# Patient Record
Sex: Female | Born: 1960 | Hispanic: Yes | Marital: Married | Smoking: Never smoker
Health system: Southern US, Community
[De-identification: ages and names within clinical notes are randomized; demographics above are authoritative.]

## PROBLEM LIST (undated history)

## (undated) DIAGNOSIS — I493 Ventricular premature depolarization: Secondary | ICD-10-CM

## (undated) DIAGNOSIS — Z87442 Personal history of urinary calculi: Secondary | ICD-10-CM

## (undated) DIAGNOSIS — Z9889 Other specified postprocedural states: Secondary | ICD-10-CM

## (undated) DIAGNOSIS — I10 Essential (primary) hypertension: Secondary | ICD-10-CM

## (undated) DIAGNOSIS — M109 Gout, unspecified: Secondary | ICD-10-CM

## (undated) DIAGNOSIS — R112 Nausea with vomiting, unspecified: Secondary | ICD-10-CM

## (undated) DIAGNOSIS — I499 Cardiac arrhythmia, unspecified: Secondary | ICD-10-CM

## (undated) DIAGNOSIS — R7303 Prediabetes: Secondary | ICD-10-CM

## (undated) DIAGNOSIS — I161 Hypertensive emergency: Secondary | ICD-10-CM

## (undated) DIAGNOSIS — N200 Calculus of kidney: Secondary | ICD-10-CM

## (undated) DIAGNOSIS — N6019 Diffuse cystic mastopathy of unspecified breast: Secondary | ICD-10-CM

## (undated) DIAGNOSIS — M549 Dorsalgia, unspecified: Secondary | ICD-10-CM

## (undated) HISTORY — PX: BREAST BIOPSY: SHX20

## (undated) HISTORY — PX: ABDOMINAL HYSTERECTOMY: SHX81

## (undated) HISTORY — DX: Dorsalgia, unspecified: M54.9

## (undated) HISTORY — DX: Calculus of kidney: N20.0

## (undated) HISTORY — DX: Diffuse cystic mastopathy of unspecified breast: N60.19

## (undated) HISTORY — PX: APPENDECTOMY: SHX54

## (undated) HISTORY — DX: Essential (primary) hypertension: I10

## (undated) HISTORY — PX: CHOLECYSTECTOMY: SHX55

---

## 2005-03-19 ENCOUNTER — Ambulatory Visit: Payer: Self-pay | Admitting: Family Medicine

## 2008-12-12 ENCOUNTER — Emergency Department: Payer: Self-pay | Admitting: Emergency Medicine

## 2010-02-05 ENCOUNTER — Emergency Department: Payer: Self-pay | Admitting: Emergency Medicine

## 2010-02-25 HISTORY — PX: COLONOSCOPY: SHX174

## 2010-11-16 ENCOUNTER — Ambulatory Visit: Payer: Self-pay | Admitting: General Surgery

## 2012-07-24 ENCOUNTER — Encounter: Payer: Self-pay | Admitting: *Deleted

## 2012-07-24 DIAGNOSIS — N6019 Diffuse cystic mastopathy of unspecified breast: Secondary | ICD-10-CM | POA: Insufficient documentation

## 2012-11-25 ENCOUNTER — Ambulatory Visit: Payer: Self-pay | Admitting: General Surgery

## 2012-12-16 ENCOUNTER — Ambulatory Visit: Payer: Self-pay

## 2012-12-28 ENCOUNTER — Encounter: Payer: Self-pay | Admitting: General Surgery

## 2012-12-30 ENCOUNTER — Encounter: Payer: Self-pay | Admitting: *Deleted

## 2013-01-12 ENCOUNTER — Encounter: Payer: Self-pay | Admitting: *Deleted

## 2013-01-23 ENCOUNTER — Emergency Department: Payer: Self-pay | Admitting: Emergency Medicine

## 2013-01-23 LAB — URINALYSIS, COMPLETE
Bilirubin,UR: NEGATIVE
Blood: NEGATIVE
Ketone: NEGATIVE
Protein: NEGATIVE
RBC,UR: <1 /HPF (ref 0–5)
Specific Gravity: 1.012 (ref 1.003–1.030)

## 2013-09-08 DIAGNOSIS — I10 Essential (primary) hypertension: Secondary | ICD-10-CM

## 2013-09-08 HISTORY — DX: Essential (primary) hypertension: I10

## 2013-12-27 ENCOUNTER — Encounter: Payer: Self-pay | Admitting: *Deleted

## 2014-06-10 ENCOUNTER — Other Ambulatory Visit: Payer: Self-pay | Admitting: Oncology

## 2014-06-10 DIAGNOSIS — Z1231 Encounter for screening mammogram for malignant neoplasm of breast: Secondary | ICD-10-CM

## 2014-07-06 ENCOUNTER — Ambulatory Visit
Admission: RE | Admit: 2014-07-06 | Discharge: 2014-07-06 | Disposition: A | Discharge status: Home / Self Care | Payer: Self-pay | Source: Ambulatory Visit | Attending: Oncology | Admitting: Oncology

## 2014-07-06 ENCOUNTER — Encounter: Payer: Self-pay | Admitting: *Deleted

## 2014-07-06 ENCOUNTER — Inpatient Hospital Stay: Payer: Self-pay | Attending: Oncology | Admitting: *Deleted

## 2014-07-06 VITALS — BP 149/94 | HR 67 | Temp 95.9°F | Resp 17 | Ht 63.39 in | Wt 170.6 lb

## 2014-07-06 DIAGNOSIS — Z1231 Encounter for screening mammogram for malignant neoplasm of breast: Secondary | ICD-10-CM

## 2014-07-06 DIAGNOSIS — Z0000 Encounter for general adult medical examination without abnormal findings: Secondary | ICD-10-CM

## 2014-07-06 NOTE — Progress Notes (Signed)
Subjective:     Patient ID: Kristine Shaw, female   DOB: 1960-04-06, 54 y.o.   MRN: [id number]  Gynecologic Exam     Review of Systems     Objective:   Physical Exam  Pulmonary/Chest: Right breast exhibits tenderness. Right breast exhibits no inverted nipple, no mass, no nipple discharge and no skin change. Left breast exhibits tenderness. Left breast exhibits no inverted nipple, no mass, no nipple discharge and no skin change.       Assessment:  54 year old English speaking Hispanic female [facility] exam and mammogram.  Taught breast health awareness. Clinical breast exam reveals bilateral generalized breast tenderness. Patient states she drinks a lot of coffee.  Encouraged to decrease caffeine intake.  She is agreeable.       Plan:    Bilateral screening mammogram ordered.  Follow-up per protocol.

## 2015-02-23 ENCOUNTER — Emergency Department
Admission: EM | Admit: 2015-02-23 | Discharge: 2015-02-23 | Disposition: A | Discharge status: Home / Self Care | Payer: PRIVATE HEALTH INSURANCE | Attending: Emergency Medicine | Admitting: Emergency Medicine

## 2015-02-23 ENCOUNTER — Emergency Department: Payer: PRIVATE HEALTH INSURANCE

## 2015-02-23 ENCOUNTER — Encounter: Payer: Self-pay | Admitting: *Deleted

## 2015-02-23 DIAGNOSIS — R05 Cough: Secondary | ICD-10-CM | POA: Diagnosis not present

## 2015-02-23 DIAGNOSIS — R1012 Left upper quadrant pain: Secondary | ICD-10-CM | POA: Diagnosis not present

## 2015-02-23 DIAGNOSIS — R110 Nausea: Secondary | ICD-10-CM | POA: Insufficient documentation

## 2015-02-23 DIAGNOSIS — M549 Dorsalgia, unspecified: Secondary | ICD-10-CM | POA: Insufficient documentation

## 2015-02-23 DIAGNOSIS — R109 Unspecified abdominal pain: Secondary | ICD-10-CM | POA: Diagnosis present

## 2015-02-23 DIAGNOSIS — Z79899 Other long term (current) drug therapy: Secondary | ICD-10-CM | POA: Insufficient documentation

## 2015-02-23 DIAGNOSIS — R0602 Shortness of breath: Secondary | ICD-10-CM | POA: Insufficient documentation

## 2015-02-23 LAB — CBC WITH DIFFERENTIAL/PLATELET
Basophils Absolute: 0.1 10*3/uL (ref 0–0.1)
Basophils Relative: 1 %
EOS ABS: 0.4 10*3/uL (ref 0–0.7)
EOS PCT: 4 %
HCT: 40.5 % (ref 35.0–47.0)
HEMOGLOBIN: 13.6 g/dL (ref 12.0–16.0)
LYMPHS ABS: 3.1 10*3/uL (ref 1.0–3.6)
LYMPHS PCT: 30 %
MCH: 28.2 pg (ref 26.0–34.0)
MCHC: 33.7 g/dL (ref 32.0–36.0)
MCV: 83.8 fL (ref 80.0–100.0)
MONOS PCT: 4 %
Monocytes Absolute: 0.4 10*3/uL (ref 0.2–0.9)
Neutro Abs: 6.3 10*3/uL (ref 1.4–6.5)
Neutrophils Relative %: 61 %
PLATELETS: 278 10*3/uL (ref 150–440)
RBC: 4.83 MIL/uL (ref 3.80–5.20)
RDW: 13.5 % (ref 11.5–14.5)
WBC: 10.3 10*3/uL (ref 3.6–11.0)

## 2015-02-23 LAB — COMPREHENSIVE METABOLIC PANEL
ALK PHOS: 84 U/L (ref 38–126)
ALT: 32 U/L (ref 14–54)
ANION GAP: 7 (ref 5–15)
AST: 21 U/L (ref 15–41)
Albumin: 4.1 g/dL (ref 3.5–5.0)
BUN: 16 mg/dL (ref 6–20)
CALCIUM: 9.6 mg/dL (ref 8.9–10.3)
CO2: 30 mmol/L (ref 22–32)
CREATININE: 1.09 mg/dL — AB (ref 0.44–1.00)
Chloride: 104 mmol/L (ref 101–111)
GFR, EST NON AFRICAN AMERICAN: 56 mL/min — AB (ref >60)
Glucose, Bld: 184 mg/dL — ABNORMAL HIGH (ref 65–99)
Potassium: 3.2 mmol/L — ABNORMAL LOW (ref 3.5–5.1)
SODIUM: 141 mmol/L (ref 135–145)
Total Bilirubin: 0.4 mg/dL (ref 0.3–1.2)
Total Protein: 7.7 g/dL (ref 6.5–8.1)

## 2015-02-23 LAB — URINALYSIS COMPLETE WITH MICROSCOPIC (ARMC ONLY)
BACTERIA UA: NONE SEEN
Bilirubin Urine: NEGATIVE
Glucose, UA: NEGATIVE mg/dL
HGB URINE DIPSTICK: NEGATIVE
Ketones, ur: NEGATIVE mg/dL
Leukocytes, UA: NEGATIVE
NITRITE: NEGATIVE
PH: 6 (ref 5.0–8.0)
PROTEIN: NEGATIVE mg/dL
RBC / HPF: NONE SEEN RBC/hpf (ref 0–5)
Specific Gravity, Urine: 1.013 (ref 1.005–1.030)

## 2015-02-23 LAB — TROPONIN I

## 2015-02-23 MED ORDER — TRAMADOL HCL 50 MG PO TABS: 50.0000 mg | ORAL_TABLET | Freq: Four times a day (QID) | ORAL | Status: DC | PRN

## 2015-02-23 NOTE — Discharge Instructions (Signed)
Flank Pain °Flank pain refers to pain that is located on the side of the body between the upper abdomen and the back. The pain may occur over a short period of time (acute) or may be long-term or reoccurring (chronic). It may be mild or severe. Flank pain can be caused by many things. °CAUSES  °Some of the more common causes of flank pain include: °· Muscle strains.   °· Muscle spasms.   °· A disease of your spine (vertebral disk disease).   °· A lung infection (pneumonia).   °· Fluid around your lungs (pulmonary edema).   °· A kidney infection.   °· Kidney stones.   °· A very painful skin rash caused by the chickenpox virus (shingles).   °· Gallbladder disease.   °HOME CARE INSTRUCTIONS  °Home care will depend on the cause of your pain. In general, °· Rest as directed by your caregiver. °· Drink enough fluids to keep your urine clear or pale yellow. °· Only take over-the-counter or prescription medicines as directed by your caregiver. Some medicines may help relieve the pain. °· Tell your caregiver about any changes in your pain. °· Follow up with your caregiver as directed. °SEEK IMMEDIATE MEDICAL CARE IF:  °· Your pain is not controlled with medicine.   °· You have new or worsening symptoms. °· Your pain increases.   °· You have abdominal pain.   °· You have shortness of breath.   °· You have persistent nausea or vomiting.   °· You have swelling in your abdomen.   °· You feel faint or pass out.   °· You have blood in your urine. °· You have a fever or persistent symptoms for more than 2-3 days. °· You have a fever and your symptoms suddenly get worse. °MAKE SURE YOU:  °· Understand these instructions. °· Will watch your condition. °· Will get help right away if you are not doing well or get worse. °  °This information is not intended to replace advice given to you by your health care provider. Make sure you discuss any questions you have with your health care provider. °  °Document Released: 04/04/2005 Document  Revised: 11/06/2011 Document Reviewed: 09/26/2011 °Elsevier Interactive Patient Education ©2016 [employer] ° °

## 2015-02-23 NOTE — ED Provider Notes (Signed)
[facility] Emergency Department Provider Note  Time seen: 7:06 PM  I have reviewed the triage vital signs and the nursing notes.   HISTORY  Chief Complaint Cough    HPI Kristine Shaw is a 54 y.o. female with a past medical history of kidney stones, back pain, presents the emergency department with left back pain, and left flank pain. According to the patient for the past several months she has intermittently had left back pain which radiates around to her left flank, under her left breast. States she has been coughing over the past one month or so however states the pain started prior to that. States for the past 2 weeks the pain has been fairly constant which she describes as a stabbing pain mostly in the left back. Denies any dysuria or hematuria. States she has had a history of kidney stones to which this feels identical. Denies any sputum production, states the cough is largely resolved at this time did have yellow sputum at a time but none recently. Denies any recent pain or swelling in the legs. Describes feeling short of breath nauseated or diaphoretic.Describes the pain as moderate, sharp in all of the.     Past Medical History  Diagnosis Date  . Diffuse cystic mastopathy   . Kidney stone   . Back pain     Patient Active Problem List   Diagnosis Date Noted  . Diffuse cystic mastopathy     Past Surgical History  Procedure Laterality Date  . Cesarean section    . Abdominal hysterectomy    . Cholecystectomy    . Appendectomy    . Colonoscopy  2012    [name]  . Breast biopsy Right     ? date done by dr Evette Cristalsankar    Current Outpatient Rx  Name  Route  Sig  Dispense  Refill  . ibuprofen (ADVIL,MOTRIN) 600 MG tablet   Oral   Take 500 mg by mouth.         . losartan-hydrochlorothiazide (HYZAAR) 50-12.5 MG per tablet      TAKE 1 TABLET BY MOUTH ONCE DAILY.           Allergies Review of patient's allergies indicates no known  allergies.  No family history on file.  Social History Social History  Substance Use Topics  . Smoking status: Never Smoker   . Smokeless tobacco: None  . Alcohol Use: No     Comment: Special occasion    Review of Systems Constitutional: Negative for fever. Cardiovascular: Positive for left back pain/left lower chest/left flank pain Respiratory: Negative for shortness of breath. Gastrointestinal: Left flank pain. Negative for nausea, vomiting, diarrhea. Genitourinary: Negative for dysuria. Negative hematuria Musculoskeletal: Positive for left back pain Neurological: Negative for headache 10-point ROS otherwise negative.  ____________________________________________   PHYSICAL EXAM:  VITAL SIGNS: ED Triage Vitals  Enc Vitals Group     BP 02/23/15 1729 127/81 mmHg     Pulse Rate 02/23/15 1729 72     Resp 02/23/15 1729 24     Temp 02/23/15 1729 98.3 F (36.8 C)     Temp Source 02/23/15 1729 Oral     SpO2 02/23/15 1729 97 %     Weight 02/23/15 1729 170 lb (77.111 kg)     Height 02/23/15 1729 5\' 2"  (1.575 m)     Head Cir --      Peak Flow --      Pain Score 02/23/15 1730 6  Pain Loc --      Pain Edu? --      Excl. in GC? --     Constitutional: Alert and oriented. Well appearing and in no distress. Eyes: Normal exam ENT   Head: Normocephalic and atraumatic   Mouth/Throat: Mucous membranes are moist. Cardiovascular: Normal rate, regular rhythm. No murmurs, rubs, or gallops. Respiratory: Normal respiratory effort without tachypnea nor retractions. Breath sounds are clear and equal bilaterally. No wheezes/rales/rhonchi. Gastrointestinal: Soft, mild left upper quadrant tenderness palpation. Moderate left CVA tenderness palpation. No rebound or guarding. No distention. Musculoskeletal: Nontender with normal range of motion in all extremities. No lower extremity tenderness or edema. Neurologic:  Normal speech and language. No gross focal neurologic  deficits Skin:  Skin is warm, dry and intact.  Psychiatric: Mood and affect are normal. Speech and behavior are normal.  ____________________________________________    EKG  EKG reviewed and interpreted by myself shows normal sinus rhythm at 73 bpm, narrow QRS, normal axis, normal intervals, nonspecific ST changes. No ST elevations.  ____________________________________________    RADIOLOGY  Chest x-ray shows no acute abnormality   INITIAL IMPRESSION / ASSESSMENT AND PLAN / ED COURSE  Pertinent labs & imaging results that were available during my care of the patient were reviewed by me and considered in my medical decision making (see chart for details).  Chest x-ray shows no acute abnormality, labs are largely within normal limits. Troponin is negative. Patient states her pain feels identical to the kidney stones she experienced approximately 10 years ago. We will check a urinalysis, and proceed with a CT abdomen/pelvis without contrast to evaluate for renal or ureteral lithiasis. Patient denies any pleuritic component to the pain. It is also possible that the pain is musculoskeletal due to the patient's recent cough.   CT shows left ovarian cyst, otherwise within normal limits. There is a 9 mm bladder calculus. I discussed results with the patient, she will follow-up with an OB/GYN for reevaluation. It is not clear if this is the cause of the patient's discomfort however. It is also possible could be musculoskeletal pain from her recent coughing. We'll place the patient a short course of Ultram, and have her follow up with OB/GYN in the next 1-2 weeks for recheck. Patient is agreeable to plan. ____________________________________________   FINAL CLINICAL IMPRESSION(S) / ED DIAGNOSES  Left flank pain   Minna Antis, MD 02/23/15 2029

## 2015-02-23 NOTE — ED Notes (Signed)
Pain under left breast past week, comes and goes, some yellow prod cough

## 2015-04-05 ENCOUNTER — Inpatient Hospital Stay: Payer: PRIVATE HEALTH INSURANCE | Attending: Obstetrics and Gynecology

## 2015-04-05 ENCOUNTER — Encounter: Payer: Self-pay | Admitting: Obstetrics and Gynecology

## 2015-04-05 ENCOUNTER — Other Ambulatory Visit: Payer: Self-pay | Admitting: *Deleted

## 2015-04-05 VITALS — BP 176/122 | HR 73 | Ht 62.0 in | Wt 173.5 lb

## 2015-04-05 DIAGNOSIS — Z90710 Acquired absence of both cervix and uterus: Secondary | ICD-10-CM | POA: Diagnosis not present

## 2015-04-05 DIAGNOSIS — N200 Calculus of kidney: Secondary | ICD-10-CM | POA: Diagnosis not present

## 2015-04-05 DIAGNOSIS — N261 Atrophy of kidney (terminal): Secondary | ICD-10-CM | POA: Diagnosis not present

## 2015-04-05 DIAGNOSIS — N83202 Unspecified ovarian cyst, left side: Secondary | ICD-10-CM | POA: Diagnosis not present

## 2015-04-05 DIAGNOSIS — Z87442 Personal history of urinary calculi: Secondary | ICD-10-CM

## 2015-04-05 DIAGNOSIS — I10 Essential (primary) hypertension: Secondary | ICD-10-CM

## 2015-04-05 DIAGNOSIS — N6011 Diffuse cystic mastopathy of right breast: Secondary | ICD-10-CM

## 2015-04-05 NOTE — Progress Notes (Signed)
Gynecologic Oncology Consult Visit   Referring Provider:  Dr Luella Cook  Chief Concern: ovarian cyst  Subjective:  Kristine Shaw is a 55 y.o. G58P4 female who is seen in consultation from Dr. Otilio Connors for 3.9 cm left ovarian cyst s/p vaginal hysterectomy about 10 years ago for menorrhagia.  Ovaries were not removed.  Came to [facility] ED 02/23/15 with left flank pain.  NO fever or urinary complaints.  CT scan showed small benign appearing left ovarian cyst 3.9 cm with septation and calcification confirmed on subsequent [place] by Dr Luella Cook.  CA125 normal at 14.  CT also showed atrophic right kidney and bladder calculus.  CBC, UA and chemistries basically normal except mildly elevated creatinine 1.09. GFR = 56.  She says she had lithotripsy for renal stones about 10 years ago.  She still has intermittent left flank pain.  CT scan IMPRESSION: 1. 3.9 cm minimally complex left ovarian cyst. This is almost certainly benign, but follow up ultrasound is recommended in 1 year according to the Society of Radiologists in Ultrasound 2010 Consensus Conference Statement (D Lenis Noon et al. Management of Asymptomatic Ovarian and Other Adnexal Cysts Imaged at [place]: Society of Radiologists in Ultrasound Consensus Conference Statement 2010. Radiology 256 (Sept 2010): 943-954.). 2. Moderate to marked right renal atrophy. 3. 9 mm bladder calculus. 4. Bilateral inguinal hernias containing fat. 5. Mild colonic diverticulosis.   Problem List: Patient Active Problem List   Diagnosis Date Noted  . BP (high blood pressure) 09/08/2013  . Diffuse cystic mastopathy     Past Medical History: Past Medical History  Diagnosis Date  . Diffuse cystic mastopathy   . Kidney stone   . Back pain   . Hypertension     Past Surgical History: Past Surgical History  Procedure Laterality Date  . Cesarean section    . Abdominal hysterectomy    . Cholecystectomy    . Appendectomy    . Colonoscopy  2012    [name]  . Breast  biopsy Right     ? date done by dr sankar      OB History:  OB History  Gravida Para Term Preterm AB SAB TAB Ectopic Multiple Living  # Outcome Date GA Lbr Len/2nd Weight Sex Delivery Anes PTL Lv  6 SAB           5 Para           4 Para           3 Para           2 Para           1 Para             Obstetric Comments  1st Menstrual Cycle:  12  1st Pregnancy:  21    Family History: No family history on file.  Social History: Social History   Social History  . Marital Status: Married    Spouse Name: N/A  . Number of Children: N/A  . Years of Education: N/A   Occupational History  . Not on file.   Social History Main Topics  . Smoking status: Never Smoker   . Smokeless tobacco: Not on file  . Alcohol Use: No     Comment: Special occasion  . Drug Use: No  . Sexual Activity: Not on file   Other Topics Concern  . Not on file   Social History Narrative  Allergies: No Known Allergies  Current Medications: Current Outpatient Prescriptions  Medication Sig Dispense Refill  . ibuprofen (ADVIL,MOTRIN) 600 MG tablet Take 500 mg by mouth.    . losartan-hydrochlorothiazide (HYZAAR) 50-12.5 MG tablet      No current facility-administered medications for this visit.    Review of Systems General: negative for, fevers, chills, fatigue, changes in sleep, changes in weight or appetite Skin: negative for changes in color, texture, moles or lesions Eyes: negative for, changes in vision, pain, diplopia HEENT: negative for, change in hearing, pain, discharge, tinnitus, vertigo, voice changes, sore throat, neck masses Pulmonary: negative for, dyspnea, orthopnea, productive cough Cardiac: negative for, palpitations, syncope, pain, discomfort, pressure Gastrointestinal: negative for, dysphagia, nausea, vomiting, jaundice, pain, constipation, diarrhea, hematemesis, hematochezia Genitourinary/Sexual: negative for, dysuria, discharge, hesitancy, nocturia,  retention, infections, STD's, incontinence Ob/Gyn: negative for irregular bleeding Musculoskeletal: negative for, pain, stiffness, swelling, range of motion limitation Hematology: negative for, easy bruising, bleeding Neurologic/Psych: negative for, headaches, seizures, paralysis, weakness, tremor, change in gait, change in sensation, mood swings, depression, anxiety, change in memory  Objective:  Physical Examination:  BP 176/122 mmHg  Pulse 73  Ht 5\' 2"  (1.575 m)  Wt 173 lb 8 oz (78.7 kg)  BMI 31.73 kg/m2  LMP 03/07/2002 (LMP Unknown)   ECOG Performance Status: 0 - Asymptomatic  General appearance: alert, cooperative and appears stated age HEENT:PERRLA and thyroid without masses Lymph node survey: non-palpable, axillary, inguinal, supraclavicular Cardiovascular: regular rate and rhythm, no murmurs or gallops Respiratory: normal air entry, lungs clear to auscultation and no rales, rhonchi or wheezing Abdomen: soft, non-tender, without masses or organomegaly, normal bowel sounds, no hernias and well healed incision Back: inspection of back is normal Extremities: extremities normal, atraumatic, no cyanosis or edema Skin exam - normal coloration and turgor, no rashes, no suspicious skin lesions noted. Neurological exam reveals alert, oriented, normal speech, no focal findings or movement disorder noted.  Pelvic: exam chaperoned by nurse;  Vulva: normal appearing vulva with no masses, tenderness or lesions; Vagina: normal vagina; Adnexa: fullness on the left, no discrete masses; Rectal: confirms    Assessment:  Kristine Shaw is a 55 y.o. female diagnosed with small left ovarian cyst and normal CA125.  I am not concerned about the risk of ovarian cancer and agree with Dr Gwinda Maine plan to do follow up ultrasounds to assure stability.  I am more concerned about her atrophic right kidney and somewhat compromised renal function and atrophic right kidney with history of renal stones s/p  lithotripsy.  The patient was not aware of any compromise of the right kidney.  Plan:   Problem List Items Addressed This Visit    None    Visit Diagnoses    Cyst of left ovary    -  Primary      We discussed the plan for follow up ultrasounds with Dr Luella Cook and that we are not concerned about ovarian cancer.  Suggested return to our clinic in the future as needed pe Dr. Luella Cook.   We will refer her to Dr. Apolinar Junes in Urology for evaluation of the abnormal right kidney and history of stones.   The patient's diagnosis, an outline of the further diagnostic and laboratory studies which will be required, the recommendation, and alternatives were discussed.  All questions were answered to the patient's satisfaction.  A total of 30 minutes were spent with the patient today; 30% was spent in education, counseling and coordination of care.  Leida Lauth, MD  CC:  [address] [facility] [address] [place], [place] [address] [phone]

## 2015-04-05 NOTE — Progress Notes (Signed)
Assisted MD with pelvic exam

## 2015-04-21 ENCOUNTER — Encounter: Payer: Self-pay | Admitting: Urology

## 2015-04-21 ENCOUNTER — Ambulatory Visit (INDEPENDENT_AMBULATORY_CARE_PROVIDER_SITE_OTHER): Payer: PRIVATE HEALTH INSURANCE | Admitting: Urology

## 2015-04-21 VITALS — BP 145/98 | HR 78 | Resp 16 | Ht 62.0 in | Wt 166.7 lb

## 2015-04-21 DIAGNOSIS — N200 Calculus of kidney: Secondary | ICD-10-CM

## 2015-04-21 LAB — URINALYSIS, COMPLETE
BILIRUBIN UA: NEGATIVE
Glucose, UA: NEGATIVE
KETONES UA: NEGATIVE
Leukocytes, UA: NEGATIVE
NITRITE UA: NEGATIVE
Protein, UA: NEGATIVE
SPEC GRAV UA: 1.01 (ref 1.005–1.030)
UUROB: 0.2 mg/dL (ref 0.2–1.0)
pH, UA: 6 (ref 5.0–7.5)

## 2015-04-21 LAB — MICROSCOPIC EXAMINATION
Bacteria, UA: NONE SEEN
WBC UA: NONE SEEN /HPF (ref 0–?)

## 2015-04-21 NOTE — Progress Notes (Signed)
04/21/2015 4:12 PM   Kristine Shaw 1960/07/28 [id number]  Referring provider: [name] [address] [place], [place] [address]  Chief Complaint  Patient presents with  . Nephrolithiasis    9mm right bladder calc.  . Establish Care    HPI: The patient is a 55 year old female with history of right atrophic kidney who presents for evaluation of a 8 mm bladder calculus seen on recent CT scan. In late December 2016, the patient developed sudden onset left flank pain consistent with pain that she's had with previous stones. Her pain resolved, and the next day she underwent a CT stone protocol. This showed a atrophic right kidney as well as an 8 mm stone within her bladder that may be a recently passed ureteral stone. She has not seen stones in her urine since that time though she is not looking. She was unaware of her atrophic right kidney. She has no other urinary issues at this time. She denies hematuria, frequency, urgency, feeling of incomplete bladder emptying.   PMH: Past Medical History  Diagnosis Date  . Diffuse cystic mastopathy   . Kidney stone   . Back pain   . Hypertension     Surgical History: Past Surgical History  Procedure Laterality Date  . Cesarean section    . Abdominal hysterectomy    . Cholecystectomy    . Appendectomy    . Colonoscopy  2012    [name]  . Breast biopsy Right     ? date done by dr sankar    Home Medications:    Medication List       This list is accurate as of: 04/21/15  4:12 PM.  Always use your most recent med list.               ibuprofen 600 MG tablet  Commonly known as:  ADVIL,MOTRIN  Take 500 mg by mouth.     losartan-hydrochlorothiazide 50-12.5 MG tablet  Commonly known as:  HYZAAR        Allergies: No Known Allergies  Family History: History reviewed. No pertinent family history.  Social History:  reports that she has never smoked. She does not have any smokeless tobacco history on  file. She reports that she does not drink alcohol or use illicit drugs.  ROS: UROLOGY Frequent Urination?: Yes Hard to postpone urination?: No Burning/pain with urination?: No Get up at night to urinate?: Yes Leakage of urine?: No Urine stream starts and stops?: No Trouble starting stream?: No Do you have to strain to urinate?: No Blood in urine?: No Urinary tract infection?: No Sexually transmitted disease?: No Injury to kidneys or bladder?: No Painful intercourse?: No Weak stream?: No Currently pregnant?: No Vaginal bleeding?: No Last menstrual period?: y-10  Gastrointestinal Nausea?: No Vomiting?: No Indigestion/heartburn?: No Diarrhea?: No Constipation?: No  Constitutional Fever: No Night sweats?: No Weight loss?: No Fatigue?: No  Skin Skin rash/lesions?: No Itching?: No  Eyes Blurred vision?: Yes Double vision?: No  Ears/Nose/Throat Sore throat?: No Sinus problems?: Yes  Hematologic/Lymphatic Swollen glands?: No Easy bruising?: No  Cardiovascular Leg swelling?: No Chest pain?: No  Respiratory Cough?: No Shortness of breath?: Yes  Endocrine Excessive thirst?: No  Musculoskeletal Back pain?: Yes Joint pain?: Yes  Neurological Headaches?: Yes Dizziness?: Yes  Psychologic Depression?: No Anxiety?: No  Physical Exam: BP 145/98 mmHg  Pulse 78  Resp 16  Ht 5\' 2"  (1.575 m)  Wt 166 lb 11.2 oz (75.615 kg)  BMI 30.48 kg/m2  LMP  03/07/2002 (LMP Unknown)  Constitutional:  Alert and oriented, No acute distress. HEENT:  AT, moist mucus membranes.  Trachea midline, no masses. Cardiovascular: No clubbing, cyanosis, or edema. Respiratory: Normal respiratory effort, no increased work of breathing. GI: Abdomen is soft, nontender, nondistended, no abdominal masses GU: No CVA tenderness.  Skin: No rashes, bruises or suspicious lesions. Lymph: No cervical or inguinal adenopathy. Neurologic: Grossly intact, no focal deficits, moving all 4  extremities. Psychiatric: Normal mood and affect.  Laboratory Data: Lab Results  Component Value Date   WBC 10.3 02/23/2015   HGB 13.6 02/23/2015   HCT 40.5 02/23/2015   MCV 83.8 02/23/2015   PLT 278 02/23/2015    Lab Results  Component Value Date   CREATININE 1.09* 02/23/2015    No results found for: PSA  No results found for: TESTOSTERONE  No results found for: HGBA1C  Urinalysis    Component Value Date/Time   COLORURINE YELLOW* 02/23/2015 1913   COLORURINE Straw 01/23/2013 1004   APPEARANCEUR CLEAR* 02/23/2015 1913   APPEARANCEUR Clear 01/23/2013 1004   LABSPEC 1.013 02/23/2015 1913   LABSPEC 1.012 01/23/2013 1004   PHURINE 6.0 02/23/2015 1913   PHURINE 6.0 01/23/2013 1004   GLUCOSEU NEGATIVE 02/23/2015 1913   GLUCOSEU Negative 01/23/2013 1004   HGBUR NEGATIVE 02/23/2015 1913   HGBUR Negative 01/23/2013 1004   BILIRUBINUR NEGATIVE 02/23/2015 1913   BILIRUBINUR Negative 01/23/2013 1004   KETONESUR NEGATIVE 02/23/2015 1913   KETONESUR Negative 01/23/2013 1004   PROTEINUR NEGATIVE 02/23/2015 1913   PROTEINUR Negative 01/23/2013 1004   NITRITE NEGATIVE 02/23/2015 1913   NITRITE Negative 01/23/2013 1004   LEUKOCYTESUR NEGATIVE 02/23/2015 1913   LEUKOCYTESUR Negative 01/23/2013 1004    Pertinent Imaging: CLINICAL DATA: Left flank pain for the past 2 weeks. History of nephrolithiasis.  EXAM: CT ABDOMEN AND PELVIS WITHOUT CONTRAST  TECHNIQUE: Multidetector CT imaging of the abdomen and pelvis was performed following the standard protocol without IV contrast.  COMPARISON: Report dated 02/23/2003.  FINDINGS: Lower chest: No acute findings.  Hepatobiliary: Cholecystectomy clips. Normal non contrasted appearance of the liver.  Pancreas: No mass or inflammatory process identified on this un-enhanced exam.  Spleen: Within normal limits in size.  Adrenals/Urinary Tract: Diffusely atrophied right kidney. Normal appearing left kidney. 9 mm  calculus in the posterior aspect of the urinary bladder, inferiorly, on the right. Normal appearing adrenal glands.  Stomach/Bowel: Scattered colonic diverticula without evidence of diverticulitis. Post appendectomy changes. No gastric or small bowel abnormalities are seen.  Vascular/Lymphatic: No pathologically enlarged lymph nodes. No evidence of abdominal aortic aneurysm.  Reproductive: Surgically absent uterus. The right ovary is not visualized. 3.9 cm left ovarian cyst containing a very small, exophytic nodular component medially and a tiny wall calcification posteriorly.  Other: Bilateral inguinal hernias containing fat.  Musculoskeletal: Mild levoconvex lumbar scoliosis. Extensive facet degenerative changes at the L4-5 level with associated grade 1 anterolisthesis. Lower thoracic spine degenerative changes.  IMPRESSION: 1. 3.9 cm minimally complex left ovarian cyst. This is almost certainly benign, but follow up ultrasound is recommended in 1 year according to the Society of Radiologists in Ultrasound 2010 Consensus Conference Statement (D Lenis Noon et al. Management of Asymptomatic Ovarian and Other Adnexal Cysts Imaged at [place]: Society of Radiologists in Ultrasound Consensus Conference Statement 2010. Radiology 256 (Sept 2010): 943-954.). 2. Moderate to marked right renal atrophy. 3. 9 mm bladder calculus. 4. Bilateral inguinal hernias containing fat. 5. Mild colonic diverticulosis.  Assessment & Plan:    1. Bladder Calculus I suspect  that this was a recently passed left ureteral stone based on her symptoms. I would however like to ensure that the stone is no longer present. We'll arrange the patient to return to the office for cystoscopy with possible stone extraction in 1-2 weeks.  2. 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 We discussed in great detail about a healthy  renal diet to protect her sole renal unit.  Return in about 2 weeks (around 05/05/2015) for cystoscopy.  [name], MD  [facility] [address], [address] [place], [place] [address] [phone]

## 2015-04-26 ENCOUNTER — Encounter: Payer: Self-pay | Admitting: Urology

## 2015-04-26 ENCOUNTER — Ambulatory Visit (INDEPENDENT_AMBULATORY_CARE_PROVIDER_SITE_OTHER): Payer: PRIVATE HEALTH INSURANCE | Admitting: Urology

## 2015-04-26 VITALS — BP 130/86 | HR 70 | Ht 62.0 in | Wt 167.4 lb

## 2015-04-26 DIAGNOSIS — N201 Calculus of ureter: Secondary | ICD-10-CM | POA: Diagnosis not present

## 2015-04-26 DIAGNOSIS — N261 Atrophy of kidney (terminal): Secondary | ICD-10-CM | POA: Diagnosis not present

## 2015-04-26 MED ORDER — CIPROFLOXACIN HCL 500 MG PO TABS
500.0000 mg | ORAL_TABLET | Freq: Once | ORAL | Status: AC
  Administered 2015-04-26: 500 mg via ORAL

## 2015-04-26 MED ORDER — LIDOCAINE HCL 2 % EX GEL
1.0000 "application " | Freq: Once | CUTANEOUS | Status: AC
  Administered 2015-04-26: 1 via URETHRAL

## 2015-04-26 NOTE — Progress Notes (Signed)
 04/26/2015 2:53 PM   Kristine Shaw 03/04/1960 [id number]  Referring provider: [facility] [address] [place], [place] [address]  Chief Complaint  Patient presents with  . Cysto    HPI: The patient is a 54-year-old female with history of right atrophic kidney who presents for evaluation of a 8 mm bladder calculus seen on recent CT scan. In late December 2016, the patient developed sudden onset left flank pain consistent with pain that she's had with previous stones. Her pain resolved, and the next day she underwent a CT stone protocol. This showed a atrophic right kidney as well as an 8 mm stone within her bladder that may be a recently passed ureteral stone. She has not seen stones in her urine since that time though she is not looking. She was unaware of her atrophic right kidney. She has no other urinary issues at this time. She denies hematuria, frequency, urgency, feeling of incomplete bladder emptying.   cystoscopy in the office today reveals  No stone in the bladder, however the right ureteral orifice was clearly dilated and there appeared to be a stone lodged in the distal right UVJ.  PMH: Past Medical History  Diagnosis Date  . Diffuse cystic mastopathy   . Kidney stone   . Back pain   . Hypertension   . BP (high blood pressure) 09/08/2013    Surgical History: Past Surgical History  Procedure Laterality Date  . Cesarean section    . Abdominal hysterectomy    . Cholecystectomy    . Appendectomy    . Colonoscopy  2012    [name]  . Breast biopsy Right     ? date done by dr sankar    Home Medications:    Medication List       This list is accurate as of: 04/26/15  2:53 PM.  Always use your most recent med list.               ibuprofen 600 MG tablet  Commonly known as:  ADVIL,MOTRIN  Take 500 mg by mouth. Reported on 04/26/2015     losartan-hydrochlorothiazide 50-12.5 MG tablet  Commonly known as:  HYZAAR        Allergies: No  Known Allergies  Family History: No family history on file.  Social History:  reports that she has never smoked. She does not have any smokeless tobacco history on file. She reports that she does not drink alcohol or use illicit drugs.  ROS: UROLOGY Frequent Urination?: No Hard to postpone urination?: No Burning/pain with urination?: No Get up at night to urinate?: Yes Leakage of urine?: No Urine stream starts and stops?: No Trouble starting stream?: No Do you have to strain to urinate?: No Blood in urine?: No Urinary tract infection?: No Sexually transmitted disease?: No Injury to kidneys or bladder?: No Painful intercourse?: No Weak stream?: No Currently pregnant?: No Vaginal bleeding?: No Last menstrual period?: y-10  Gastrointestinal Nausea?: No Vomiting?: No Indigestion/heartburn?: No Diarrhea?: No Constipation?: No  Constitutional Fever: No Night sweats?: No Weight loss?: No Fatigue?: No  Skin Skin rash/lesions?: No Itching?: No  Eyes Blurred vision?: No Double vision?: No  Ears/Nose/Throat Sore throat?: No Sinus problems?: No  Hematologic/Lymphatic Swollen glands?: No Easy bruising?: No  Cardiovascular Leg swelling?: No Chest pain?: No  Respiratory Cough?: No Shortness of breath?: No  Endocrine Excessive thirst?: No  Musculoskeletal Back pain?: No Joint pain?: No  Neurological Headaches?: Yes Dizziness?: No  Psychologic Depression?: No Anxiety?: No    Physical Exam: BP 130/86 mmHg  Pulse 70  Ht 5' 2" (1.575 m)  Wt 167 lb 6.4 oz (75.932 kg)  BMI 30.61 kg/m2  LMP 03/07/2002 (LMP Unknown)  Constitutional:  Alert and oriented, No acute distress. HEENT: [place] AT, moist mucus membranes.  Trachea midline, no masses. Cardiovascular: No clubbing, cyanosis, or edema. Respiratory: Normal respiratory effort, no increased work of breathing. GI: Abdomen is soft, nontender, nondistended, no abdominal masses GU: No CVA tenderness.  Skin:  No rashes, bruises or suspicious lesions. Lymph: No cervical or inguinal adenopathy. Neurologic: Grossly intact, no focal deficits, moving all 4 extremities. Psychiatric: Normal mood and affect.  Laboratory Data: Lab Results  Component Value Date   WBC 10.3 02/23/2015   HGB 13.6 02/23/2015   HCT 40.5 02/23/2015   MCV 83.8 02/23/2015   PLT 278 02/23/2015    Lab Results  Component Value Date   CREATININE 1.09* 02/23/2015    No results found for: PSA  No results found for: TESTOSTERONE  No results found for: HGBA1C  Urinalysis    Component Value Date/Time   COLORURINE YELLOW* 02/23/2015 1913   COLORURINE Straw 01/23/2013 1004   APPEARANCEUR CLEAR* 02/23/2015 1913   APPEARANCEUR Clear 01/23/2013 1004   LABSPEC 1.013 02/23/2015 1913   LABSPEC 1.012 01/23/2013 1004   PHURINE 6.0 02/23/2015 1913   PHURINE 6.0 01/23/2013 1004   GLUCOSEU Negative 04/21/2015 1528   GLUCOSEU Negative 01/23/2013 1004   HGBUR NEGATIVE 02/23/2015 1913   HGBUR Negative 01/23/2013 1004   BILIRUBINUR Negative 04/21/2015 1528   BILIRUBINUR NEGATIVE 02/23/2015 1913   BILIRUBINUR Negative 01/23/2013 1004   KETONESUR NEGATIVE 02/23/2015 1913   KETONESUR Negative 01/23/2013 1004   PROTEINUR NEGATIVE 02/23/2015 1913   PROTEINUR Negative 01/23/2013 1004   NITRITE Negative 04/21/2015 1528   NITRITE NEGATIVE 02/23/2015 1913   NITRITE Negative 01/23/2013 1004   LEUKOCYTESUR Negative 04/21/2015 1528   LEUKOCYTESUR NEGATIVE 02/23/2015 1913   LEUKOCYTESUR Negative 01/23/2013 1004    Pertinent Imaging: CLINICAL DATA: Left flank pain for the past 2 weeks. History of nephrolithiasis.  EXAM: CT ABDOMEN AND PELVIS WITHOUT CONTRAST  TECHNIQUE: Multidetector CT imaging of the abdomen and pelvis was performed following the standard protocol without IV contrast.  COMPARISON: Report dated 02/23/2003.  FINDINGS: Lower chest: No acute findings.  Hepatobiliary: Cholecystectomy clips. Normal non  contrasted appearance of the liver.  Pancreas: No mass or inflammatory process identified on this un-enhanced exam.  Spleen: Within normal limits in size.  Adrenals/Urinary Tract: Diffusely atrophied right kidney. Normal appearing left kidney. 9 mm calculus in the posterior aspect of the urinary bladder, inferiorly, on the right. Normal appearing adrenal glands.  Stomach/Bowel: Scattered colonic diverticula without evidence of diverticulitis. Post appendectomy changes. No gastric or small bowel abnormalities are seen.  Vascular/Lymphatic: No pathologically enlarged lymph nodes. No evidence of abdominal aortic aneurysm.  Reproductive: Surgically absent uterus. The right ovary is not visualized. 3.9 cm left ovarian cyst containing a very small, exophytic nodular component medially and a tiny wall calcification posteriorly.  Other: Bilateral inguinal hernias containing fat.  Musculoskeletal: Mild levoconvex lumbar scoliosis. Extensive facet degenerative changes at the L4-5 level with associated grade 1 anterolisthesis. Lower thoracic spine degenerative changes.  IMPRESSION: 1. 3.9 cm minimally complex left ovarian cyst. This is almost certainly benign, but follow up ultrasound is recommended in 1 year according to the Society of Radiologists in Ultrasound 2010 Consensus Conference Statement (D Levine et al. Management of Asymptomatic Ovarian and Other Adnexal Cysts Imaged at US: Society of   Radiologists in Ultrasound Consensus Conference Statement 2010. Radiology 256 (Sept 2010): 943-954.). 2. Moderate to marked right renal atrophy. 3. 9 mm bladder calculus. 4. Bilateral inguinal hernias containing fat. 5. Mild colonic diverticulosis.    Cystoscopy Procedure Note  Patient identification was confirmed, informed consent was obtained, and patient was prepped using Betadine solution.  Lidocaine jelly was administered per urethral meatus.    Preoperative abx where  received prior to procedure.    Procedure: - Flexible cystoscope introduced, without any difficulty.   - Thorough search of the bladder revealed:    normal urethral meatus    normal urothelium    no stones in bladder    no ulcers     no tumors    no urethral polyps    no trabeculation  - The right ureteral orifices dilated. A stone is lodged in the distal UVJ on the right. Left UO was normal.  Post-Procedure: - Patient tolerated the procedure well   Assessment & Plan:    1. Right impacted UVJ stone with right atrophic kidney  The patient appears to have a stone impacted in the right UVJ on cystoscopy today. This  Likely explains her atrophic kidney as this has likely been there for some time. It was measured as 9 mm and thought to be in the bladder on recent CT scan.  She does still appear to have some function left of her right atrophic kidney though its unclear as to how much. I discussed the patient that would be in her best interest to remove this stone and unobstruct this kidney as it may still have some residual function. I did discuss that there is no way to truly tell kidneys function until it is unobstructed. We discussed cystoscopy, right ureteroscopy, stent placement with laser lithotripsy in great detail. She understands risks, benefits, indications of this procedure. She understands that whatever function of her kidney that is re: been lost is not salvageable but there still may be some residual function. We can consider a renogram after  On obstructing her system to fully evaluate the kidneys function if she is interested.  2. Right atrophic kidney As above  Return for surgery.  [name], MD  [facility] [address] [place], [place] [address] [phone]

## 2015-04-27 ENCOUNTER — Telehealth: Payer: Self-pay | Admitting: Radiology

## 2015-04-27 LAB — URINALYSIS, COMPLETE
BILIRUBIN UA: NEGATIVE
Glucose, UA: NEGATIVE
Ketones, UA: NEGATIVE
Leukocytes, UA: NEGATIVE
Nitrite, UA: NEGATIVE
PH UA: 5.5 (ref 5.0–7.5)
Protein, UA: NEGATIVE
RBC UA: NEGATIVE
Specific Gravity, UA: 1.015 (ref 1.005–1.030)
UUROB: 0.2 mg/dL (ref 0.2–1.0)

## 2015-04-27 LAB — MICROSCOPIC EXAMINATION
BACTERIA UA: NONE SEEN
WBC UA: NONE SEEN /HPF (ref 0–?)

## 2015-04-27 NOTE — Telephone Encounter (Signed)
Notified pt of surgery scheduled 05/17/15, pre-admit testing appt on 3/8 :30 and to call day prior to surgery for arrival time to [facility]. Advised pt to hold ibuprofen 3 days prior to surgery. Pt voices understanding.

## 2015-04-27 NOTE — Telephone Encounter (Signed)
This pt will have a urine culture performed at her pre-admit appt. Thanks.

## 2015-04-27 NOTE — Telephone Encounter (Signed)
-----   Message from Harle Battiest, PA-C sent at 04/27/2015  8:47 AM EST ----- According to Dr. Jorja Loa last note, he is scheduling this patient for surgery.  I know he wants his patients to have urine cultures ordered prior to their surgery.  Is she having a urine culture performed at her pre-op appointment?

## 2015-05-03 ENCOUNTER — Inpatient Hospital Stay: Admission: RE | Admit: 2015-05-03 | Payer: PRIVATE HEALTH INSURANCE | Source: Ambulatory Visit

## 2015-05-06 DIAGNOSIS — I10 Essential (primary) hypertension: Secondary | ICD-10-CM | POA: Insufficient documentation

## 2015-05-06 DIAGNOSIS — N83209 Unspecified ovarian cyst, unspecified side: Secondary | ICD-10-CM | POA: Insufficient documentation

## 2015-05-09 ENCOUNTER — Encounter
Admission: RE | Admit: 2015-05-09 | Discharge: 2015-05-09 | Disposition: A | Discharge status: Home / Self Care | Payer: PRIVATE HEALTH INSURANCE | Source: Ambulatory Visit | Attending: Urology | Admitting: Urology

## 2015-05-09 DIAGNOSIS — Z01812 Encounter for preprocedural laboratory examination: Secondary | ICD-10-CM | POA: Diagnosis present

## 2015-05-09 HISTORY — DX: Other specified postprocedural states: Z98.890

## 2015-05-09 HISTORY — DX: Nausea with vomiting, unspecified: R11.2

## 2015-05-09 LAB — CBC
HCT: 41.5 % (ref 35.0–47.0)
Hemoglobin: 13.8 g/dL (ref 12.0–16.0)
MCH: 27.9 pg (ref 26.0–34.0)
MCHC: 33.3 g/dL (ref 32.0–36.0)
MCV: 83.7 fL (ref 80.0–100.0)
PLATELETS: 255 10*3/uL (ref 150–440)
RBC: 4.95 MIL/uL (ref 3.80–5.20)
RDW: 13.5 % (ref 11.5–14.5)
WBC: 9.8 10*3/uL (ref 3.6–11.0)

## 2015-05-09 LAB — URINALYSIS COMPLETE WITH MICROSCOPIC (ARMC ONLY)
BILIRUBIN URINE: NEGATIVE
Bacteria, UA: NONE SEEN
GLUCOSE, UA: NEGATIVE mg/dL
Ketones, ur: NEGATIVE mg/dL
LEUKOCYTES UA: NEGATIVE
Nitrite: NEGATIVE
Protein, ur: NEGATIVE mg/dL
Specific Gravity, Urine: 1.009 (ref 1.005–1.030)
pH: 5 (ref 5.0–8.0)

## 2015-05-09 NOTE — Patient Instructions (Signed)
  Your procedure is scheduled on: Wednesday 05/17/15 Report to Day Surgery. 2ND FLOOR MEDICAL MALL ENTRANCE To find out your arrival time please call [phone] between 1PM - 3PM on Tuesday 05/16/15.  Remember: Instructions that are not followed completely may result in serious medical risk, up to and including death, or upon the discretion of your surgeon and anesthesiologist your surgery may need to be rescheduled.    __X__ 1. Do not eat food or drink liquids after midnight. No gum chewing or hard candies.     __X__ 2. No Alcohol for 24 hours before or after surgery.   ____ 3. Bring all medications with you on the day of surgery if instructed.    __X__ 4. Notify your doctor if there is any change in your medical condition     (cold, fever, infections).     Do not wear jewelry, make-up, hairpins, clips or nail polish.  Do not wear lotions, powders, or perfumes.   Do not shave 48 hours prior to surgery. Men may shave face and neck.  Do not bring valuables to the hospital.    [facility] is not responsible for any belongings or valuables.               Contacts, dentures or bridgework may not be worn into surgery.  Leave your suitcase in the car. After surgery it may be brought to your room.  For patients admitted to the hospital, discharge time is determined by your                treatment team.   Patients discharged the day of surgery will not be allowed to drive home.   Please read over the following fact sheets that you were given:   Surgical Site Infection Prevention   ____ Take these medicines the morning of surgery with A SIP OF WATER:    1. NONE  2.   3.   4.  5.  6.  ____ Fleet Enema (as directed)   ____ Use CHG Soap as directed  ____ Use inhalers on the day of surgery  ____ Stop metformin 2 days prior to surgery    ____ Take 1/2 of usual insulin dose the night before surgery and none on the morning of surgery.   ____ Stop Coumadin/Plavix/aspirin on    __X__ Stop Anti-inflammatories on STOP IBUPROFEN UNTIL AFTER SURGERY MAY USE TYLENOL FOR PAIN   ____ Stop supplements until after surgery.    ____ Bring C-Pap to the hospital.

## 2015-05-10 ENCOUNTER — Other Ambulatory Visit: Payer: PRIVATE HEALTH INSURANCE

## 2015-05-15 ENCOUNTER — Other Ambulatory Visit: Payer: PRIVATE HEALTH INSURANCE

## 2015-05-15 ENCOUNTER — Telehealth: Payer: Self-pay | Admitting: Radiology

## 2015-05-15 DIAGNOSIS — N200 Calculus of kidney: Secondary | ICD-10-CM

## 2015-05-15 DIAGNOSIS — Z01818 Encounter for other preprocedural examination: Secondary | ICD-10-CM

## 2015-05-15 LAB — URINALYSIS, COMPLETE
Bilirubin, UA: NEGATIVE
Glucose, UA: NEGATIVE
KETONES UA: NEGATIVE
Leukocytes, UA: NEGATIVE
NITRITE UA: NEGATIVE
Protein, UA: NEGATIVE
RBC UA: NEGATIVE
SPEC GRAV UA: 1.01 (ref 1.005–1.030)
Urobilinogen, Ur: 0.2 mg/dL (ref 0.2–1.0)
pH, UA: 5.5 (ref 5.0–7.5)

## 2015-05-15 LAB — MICROSCOPIC EXAMINATION
Bacteria, UA: NONE SEEN
RBC MICROSCOPIC, UA: NONE SEEN /HPF (ref 0–?)

## 2015-05-15 NOTE — Telephone Encounter (Signed)
Notified pt that surgery has been r/s to 05/24/15 & she needs to call day prior to surgery for arrival time to [facility]. Pt to RTC 3/20 for ua & ucx prior to surgery. Pt voiced understanding.

## 2015-05-15 NOTE — Telephone Encounter (Signed)
LMOM. Need to notify pt that per Dr Sherryl BartersBudzyn we will have to r/s surgery from 3/22 to 3/29 & she needs to RTC asap for ua & urine culture.

## 2015-05-17 LAB — CULTURE, URINE COMPREHENSIVE

## 2015-05-24 ENCOUNTER — Ambulatory Visit
Admission: RE | Admit: 2015-05-24 | Discharge: 2015-05-24 | Disposition: A | Discharge status: Home / Self Care | Payer: PRIVATE HEALTH INSURANCE | Source: Ambulatory Visit | Attending: Urology | Admitting: Urology

## 2015-05-24 ENCOUNTER — Ambulatory Visit: Payer: PRIVATE HEALTH INSURANCE | Admitting: Anesthesiology

## 2015-05-24 ENCOUNTER — Encounter: Payer: Self-pay | Admitting: *Deleted

## 2015-05-24 ENCOUNTER — Encounter
Admission: RE | Disposition: A | Discharge status: Home / Self Care | Payer: Self-pay | Source: Ambulatory Visit | Attending: Urology

## 2015-05-24 DIAGNOSIS — Z79899 Other long term (current) drug therapy: Secondary | ICD-10-CM | POA: Insufficient documentation

## 2015-05-24 DIAGNOSIS — N132 Hydronephrosis with renal and ureteral calculous obstruction: Secondary | ICD-10-CM | POA: Diagnosis not present

## 2015-05-24 DIAGNOSIS — Z9049 Acquired absence of other specified parts of digestive tract: Secondary | ICD-10-CM | POA: Diagnosis not present

## 2015-05-24 DIAGNOSIS — N261 Atrophy of kidney (terminal): Secondary | ICD-10-CM | POA: Diagnosis not present

## 2015-05-24 DIAGNOSIS — K4020 Bilateral inguinal hernia, without obstruction or gangrene, not specified as recurrent: Secondary | ICD-10-CM | POA: Diagnosis not present

## 2015-05-24 DIAGNOSIS — K5730 Diverticulosis of large intestine without perforation or abscess without bleeding: Secondary | ICD-10-CM | POA: Insufficient documentation

## 2015-05-24 DIAGNOSIS — Z90710 Acquired absence of both cervix and uterus: Secondary | ICD-10-CM | POA: Diagnosis not present

## 2015-05-24 DIAGNOSIS — R109 Unspecified abdominal pain: Secondary | ICD-10-CM | POA: Insufficient documentation

## 2015-05-24 DIAGNOSIS — M549 Dorsalgia, unspecified: Secondary | ICD-10-CM | POA: Diagnosis not present

## 2015-05-24 DIAGNOSIS — I10 Essential (primary) hypertension: Secondary | ICD-10-CM | POA: Insufficient documentation

## 2015-05-24 DIAGNOSIS — N201 Calculus of ureter: Secondary | ICD-10-CM | POA: Diagnosis not present

## 2015-05-24 DIAGNOSIS — Z87442 Personal history of urinary calculi: Secondary | ICD-10-CM | POA: Insufficient documentation

## 2015-05-24 HISTORY — PX: CYSTOSCOPY WITH STENT PLACEMENT: SHX5790

## 2015-05-24 HISTORY — PX: URETEROSCOPY WITH HOLMIUM LASER LITHOTRIPSY: SHX6645

## 2015-05-24 SURGERY — URETEROSCOPY, WITH LITHOTRIPSY USING HOLMIUM LASER
Anesthesia: General | Laterality: Right | Wound class: Clean Contaminated

## 2015-05-24 MED ORDER — CEPHALEXIN 500 MG PO CAPS: 500.0000 mg | ORAL_CAPSULE | Freq: Three times a day (TID) | ORAL | Status: DC

## 2015-05-24 MED ORDER — FAMOTIDINE 20 MG PO TABS
20.0000 mg | ORAL_TABLET | Freq: Once | ORAL | Status: AC
  Administered 2015-05-24: 20 mg via ORAL

## 2015-05-24 MED ORDER — LIDOCAINE HCL (CARDIAC) 20 MG/ML IV SOLN
INTRAVENOUS | Status: DC | PRN
  Administered 2015-05-24: 100 mg via INTRAVENOUS

## 2015-05-24 MED ORDER — FENTANYL CITRATE (PF) 100 MCG/2ML IJ SOLN: 25.0000 ug | INTRAMUSCULAR | Status: DC | PRN

## 2015-05-24 MED ORDER — DEXAMETHASONE SODIUM PHOSPHATE 10 MG/ML IJ SOLN
INTRAMUSCULAR | Status: DC | PRN
  Administered 2015-05-24: 10 mg via INTRAVENOUS

## 2015-05-24 MED ORDER — SCOPOLAMINE 1 MG/3DAYS TD PT72
1.0000 | MEDICATED_PATCH | TRANSDERMAL | Status: DC
  Administered 2015-05-24: 1.5 mg via TRANSDERMAL

## 2015-05-24 MED ORDER — GLYCOPYRROLATE 0.2 MG/ML IJ SOLN
INTRAMUSCULAR | Status: DC | PRN
  Administered 2015-05-24: .2 mg via INTRAVENOUS

## 2015-05-24 MED ORDER — FENTANYL CITRATE (PF) 100 MCG/2ML IJ SOLN
INTRAMUSCULAR | Status: DC | PRN
  Administered 2015-05-24 (×2): 50 ug via INTRAVENOUS

## 2015-05-24 MED ORDER — LACTATED RINGERS IV SOLN
INTRAVENOUS | Status: DC
  Administered 2015-05-24: 09:00:00 via INTRAVENOUS

## 2015-05-24 MED ORDER — EPHEDRINE SULFATE 50 MG/ML IJ SOLN
INTRAMUSCULAR | Status: DC | PRN
Start: 2015-05-24 — End: 2015-05-24
  Administered 2015-05-24 (×3): 10 mg via INTRAVENOUS

## 2015-05-24 MED ORDER — CEFAZOLIN SODIUM-DEXTROSE 2-4 GM/100ML-% IV SOLN
2.0000 g | Freq: Once | INTRAVENOUS | Status: AC
  Administered 2015-05-24: 2 g via INTRAVENOUS

## 2015-05-24 MED ORDER — ONDANSETRON HCL 4 MG/2ML IJ SOLN
INTRAMUSCULAR | Status: DC | PRN
  Administered 2015-05-24: 4 mg via INTRAVENOUS

## 2015-05-24 MED ORDER — OXYCODONE HCL 5 MG/5ML PO SOLN: 5.0000 mg | Freq: Once | ORAL | Status: DC | PRN

## 2015-05-24 MED ORDER — OXYCODONE HCL 5 MG PO TABS: 5.0000 mg | ORAL_TABLET | Freq: Once | ORAL | Status: DC | PRN

## 2015-05-24 MED ORDER — HYDROCODONE-ACETAMINOPHEN 5-325 MG PO TABS: 1.0000 | ORAL_TABLET | Freq: Four times a day (QID) | ORAL | Status: DC | PRN

## 2015-05-24 MED ORDER — MIDAZOLAM HCL 2 MG/2ML IJ SOLN
INTRAMUSCULAR | Status: DC | PRN
  Administered 2015-05-24: 2 mg via INTRAVENOUS

## 2015-05-24 MED ORDER — PROPOFOL 10 MG/ML IV BOLUS
INTRAVENOUS | Status: DC | PRN
  Administered 2015-05-24: 150 mg via INTRAVENOUS

## 2015-05-24 SURGICAL SUPPLY — 29 items
BACTOSHIELD CHG 4% 4OZ (MISCELLANEOUS) ×2
BASKET ZERO TIP 1.9FR (BASKET) ×3 IMPLANT
CATH URETL 5X70 OPEN END (CATHETERS) ×3 IMPLANT
CNTNR SPEC 2.5X3XGRAD LEK (MISCELLANEOUS) ×1
CONT SPEC 4OZ STER OR WHT (MISCELLANEOUS) ×2
CONTAINER SPEC 2.5X3XGRAD LEK (MISCELLANEOUS) ×1 IMPLANT
FEE TECHNICIAN ONLY PER HOUR (MISCELLANEOUS) IMPLANT
GLOVE BIO SURGEON STRL SZ7 (GLOVE) ×6 IMPLANT
GLOVE BIO SURGEON STRL SZ7.5 (GLOVE) ×3 IMPLANT
GOWN STRL REUS W/ TWL LRG LVL4 (GOWN DISPOSABLE) ×1 IMPLANT
GOWN STRL REUS W/TWL LRG LVL4 (GOWN DISPOSABLE) ×2
GOWN STRL REUS W/TWL XL LVL3 (GOWN DISPOSABLE) ×3 IMPLANT
GUIDEWIRE SUPER STIFF (WIRE) IMPLANT
KIT RM TURNOVER CYSTO AR (KITS) ×3 IMPLANT
LASER FIBER 200M SMARTSCOPE (Laser) ×3 IMPLANT
LASER HOLMIUM FIBER SU 272UM (MISCELLANEOUS) IMPLANT
PACK CYSTO AR (MISCELLANEOUS) ×3 IMPLANT
SCRUB CHG 4% DYNA-HEX 4OZ (MISCELLANEOUS) ×1 IMPLANT
SENSORWIRE 0.038 NOT ANGLED (WIRE) ×3
SET CYSTO W/LG BORE CLAMP LF (SET/KITS/TRAYS/PACK) ×3 IMPLANT
SHEATH URETERAL 13/15X36 1L (SHEATH) IMPLANT
SOL .9 NS 3000ML IRR  AL (IV SOLUTION) ×2
SOL .9 NS 3000ML IRR UROMATIC (IV SOLUTION) ×1 IMPLANT
STENT URET 6FRX24 CONTOUR (STENTS) ×3 IMPLANT
STENT URET 6FRX26 CONTOUR (STENTS) IMPLANT
SURGILUBE 2OZ TUBE FLIPTOP (MISCELLANEOUS) ×3 IMPLANT
SYRINGE IRR TOOMEY STRL 70CC (SYRINGE) ×3 IMPLANT
WATER STERILE IRR 1000ML POUR (IV SOLUTION) ×3 IMPLANT
WIRE SENSOR 0.038 NOT ANGLED (WIRE) ×1 IMPLANT

## 2015-05-24 NOTE — Anesthesia Preprocedure Evaluation (Signed)
Anesthesia Evaluation  Patient identified by MRN, date of birth, ID band Patient awake    Reviewed: Allergy & Precautions, H&P , NPO status , Patient's Chart, lab work & pertinent test results  History of Anesthesia Complications (+) PONV and history of anesthetic complications  Airway Mallampati: III  TM Distance: <3 FB Neck ROM: limited    Dental  (+) Poor Dentition, Chipped   Pulmonary neg pulmonary ROS, neg shortness of breath,    Pulmonary exam normal breath sounds clear to auscultation       Cardiovascular Exercise Tolerance: Good hypertension, (-) angina(-) Past MI Normal cardiovascular exam Rhythm:regular Rate:Normal     Neuro/Psych negative neurological ROS  negative psych ROS   GI/Hepatic negative GI ROS, Neg liver ROS, neg GERD  ,  Endo/Other  negative endocrine ROS  Renal/GU Renal disease  negative genitourinary   Musculoskeletal   Abdominal   Peds  Hematology negative hematology ROS (+)   Anesthesia Other Findings Past Medical History:   Diffuse cystic mastopathy                                    Kidney stone                                                 Back pain                                                    Hypertension                                                 BP (high blood pressure)                        09/08/2013    PONV (postoperative nausea and vomiting)                    Past Surgical History:   CESAREAN SECTION                                              ABDOMINAL HYSTERECTOMY                                        CHOLECYSTECTOMY                                               APPENDECTOMY  COLONOSCOPY                                      2012           Comment:[name]   BREAST BIOPSY                                   Right                Comment:? date done by dr [name]     Reproductive/Obstetrics negative OB ROS                              Anesthesia Physical Anesthesia Plan  ASA: III  Anesthesia Plan: General LMA   Post-op Pain Management:    Induction:   Airway Management Planned:   Additional Equipment:   Intra-op Plan:   Post-operative Plan:   Informed Consent: I have reviewed the patients History and Physical, chart, labs and discussed the procedure including the risks, benefits and alternatives for the proposed anesthesia with the patient or authorized representative who has indicated his/her understanding and acceptance.   Dental Advisory Given  Plan Discussed with: Anesthesiologist, CRNA and Surgeon  Anesthesia Plan Comments:         Anesthesia Quick Evaluation

## 2015-05-24 NOTE — Anesthesia Postprocedure Evaluation (Signed)
Anesthesia Post Note  Patient: Kristine Shaw  Procedure(s) Performed: Procedure(s) (LRB): URETEROSCOPY WITH HOLMIUM LASER LITHOTRIPSY (Right) CYSTOSCOPY WITH STENT PLACEMENT (Right)  Patient location during evaluation: PACU Anesthesia Type: General Level of consciousness: awake and alert Pain management: pain level controlled Vital Signs Assessment: post-procedure vital signs reviewed and stable Respiratory status: spontaneous breathing, nonlabored ventilation, respiratory function stable and patient connected to nasal cannula oxygen Cardiovascular status: blood pressure returned to baseline and stable Postop Assessment: no signs of nausea or vomiting Anesthetic complications: no    Last Vitals:  Filed Vitals:   05/24/15 1229 05/24/15 1326  BP: 140/81 131/84  Pulse: 62 65  Temp: 36.4 C 36.5 C  Resp: 16 16    Last Pain:  Filed Vitals:   05/24/15 1327  PainSc: 0-No pain                 Cleda MccreedyJoseph K Piscitello

## 2015-05-24 NOTE — Anesthesia Procedure Notes (Signed)
Procedure Name: LMA Insertion Date/Time: 05/24/2015 10:35 AM Performed by: Michaele OfferSAVAGE, [place] Borchardt Pre-anesthesia Checklist: Patient identified, Emergency Drugs available, Suction available, Patient being monitored and Timeout performed Patient Re-evaluated:Patient Re-evaluated prior to inductionOxygen Delivery Method: Circle system utilized Preoxygenation: Pre-oxygenation with 100% oxygen Intubation Type: IV induction Ventilation: Mask ventilation without difficulty LMA: LMA inserted LMA Size: 4.0 Number of attempts: 1 Placement Confirmation: positive ETCO2 and breath sounds checked- equal and bilateral Tube secured with: Tape Dental Injury: Teeth and Oropharynx as per pre-operative assessment

## 2015-05-24 NOTE — Op Note (Signed)
Date of procedure: 05/24/2015  Preoperative diagnosis:  1. Right ureteral stone 2. Mild to moderate right renal atrophy   Postoperative diagnosis:  1. Right ureteral stone  2. Mild to moderate right renal atrophy  Procedure: 1. Cystoscopy 2. Right ureteroscopy 3. Laser lithotripsy 4. Stone basketing 5. Right retrograde pyelogram with interpretation 6. Right ureteral stent placement 6 French by 24 cm  Surgeon: Baruch Gouty, MD  Anesthesia: General  Complications: None  Intraoperative findings: The patient had impacted approximately 1 cm stone in the distal right ureter. The stone was removed in its entirety with laser lithotripsy and stone basket. Right retrograde pyelogram at the end of the case showed no further filling defects and good drainage on postdrainage film.  EBL: None  Specimens: Right ureteral stone  Drains: 6 French by 24 cm right double-J ureteral stent  Disposition: Stable to the postanesthesia care unit  Indication for procedure: The patient is a 55 y.o. female with an impacted 1 cm right UVJ stone that has caused mild to moderate right renal age of 76. She presents today for stone removal in an attempt to salvage remaining renal function on the right side.  After reviewing the management options for treatment, the patient elected to proceed with the above surgical procedure(s). We have discussed the potential benefits and risks of the procedure, side effects of the proposed treatment, the likelihood of the patient achieving the goals of the procedure, and any potential problems that might occur during the procedure or recuperation. Informed consent has been obtained.  Description of procedure: The patient was met in the preoperative area. All risks, benefits, and indications of the procedure were described in great detail. The patient consented to the procedure. Preoperative antibiotics were given. The patient was taken to the operative theater. General  anesthesia was induced per the anesthesia service. The patient was then placed in the dorsal lithotomy position and prepped and draped in the usual sterile fashion. A preoperative timeout was called.  A 21 French 30 cystoscope was inserted into the patient's bladder atraumatically per urethra. The right impacted UVJ stone was immediately visualized in the right UO. A sensor wire was passed past this up to the level of the right renal pelvis under fluoroscopy. A semirigid ureteroscope was used then to break the stone into smaller fragments with laser lithotripsy. The smaller fragment was removed and sent to pathology. Pan ureteroscopy at this point showed no residual stone burden. A retrograde polygrams obtained in the right. This showed no further filling defects and good drainage on delayed films. The decision now however was made to leave a ureteral stent as I was concerned that the area where the stone was impacted is at risk for scar and stricture. 6 [place] by 24 cm double-J ureter stent was then placed over the sensor wire through the cystoscope up to the level of the renal pelvis. A sensor wire was removed. The stent was confirmed to be in the correct location with a curl seen in the patient's renal pelvis on fluoroscopy and a curl seen in the patient's urinary bladder direct visualization. The patient's bladder was drained and all stone fragments were evacuated and sent to pathology. There is noted to be good urine following from the distal end of the stent. This point the patient's bladder was drained and she was woken from anesthesia and transferred in stable condition to the post anesthesia care unit.  Plan: The patient will follow-up in one week to remove her stent in the  office. She'll have a renal ultrasound in 1 month for a iatrogenic hydronephrosis. We can also order a renogram to assess renal function on the right side the patient is interested in knowing how much function remains on the right  side.  Baruch Gouty, M.D.

## 2015-05-24 NOTE — H&P (View-Only) (Signed)
04/26/2015 2:53 PM   Kristine Shaw August 24, 1960 [id number]  Referring provider: [name] [address] [place], [place] [address]  Chief Complaint  Patient presents with  . Cysto    HPI: The patient is a 55 year old female with history of right atrophic kidney who presents for evaluation of a 8 mm bladder calculus seen on recent CT scan. In late December 2016, the patient developed sudden onset left flank pain consistent with pain that she's had with previous stones. Her pain resolved, and the next day she underwent a CT stone protocol. This showed a atrophic right kidney as well as an 8 mm stone within her bladder that may be a recently passed ureteral stone. She has not seen stones in her urine since that time though she is not looking. She was unaware of her atrophic right kidney. She has no other urinary issues at this time. She denies hematuria, frequency, urgency, feeling of incomplete bladder emptying.   cystoscopy in the office today reveals  No stone in the bladder, however the right ureteral orifice was clearly dilated and there appeared to be a stone lodged in the distal right UVJ.  PMH: Past Medical History  Diagnosis Date  . Diffuse cystic mastopathy   . Kidney stone   . Back pain   . Hypertension   . BP (high blood pressure) 09/08/2013    Surgical History: Past Surgical History  Procedure Laterality Date  . Cesarean section    . Abdominal hysterectomy    . Cholecystectomy    . Appendectomy    . Colonoscopy  2012    [name]  . Breast biopsy Right     ? date done by dr sankar    Home Medications:    Medication List       This list is accurate as of: 04/26/15  2:53 PM.  Always use your most recent med list.               ibuprofen 600 MG tablet  Commonly known as:  ADVIL,MOTRIN  Take 500 mg by mouth. Reported on 04/26/2015     losartan-hydrochlorothiazide 50-12.5 MG tablet  Commonly known as:  HYZAAR        Allergies: No  Known Allergies  Family History: No family history on file.  Social History:  reports that she has never smoked. She does not have any smokeless tobacco history on file. She reports that she does not drink alcohol or use illicit drugs.  ROS: UROLOGY Frequent Urination?: No Hard to postpone urination?: No Burning/pain with urination?: No Get up at night to urinate?: Yes Leakage of urine?: No Urine stream starts and stops?: No Trouble starting stream?: No Do you have to strain to urinate?: No Blood in urine?: No Urinary tract infection?: No Sexually transmitted disease?: No Injury to kidneys or bladder?: No Painful intercourse?: No Weak stream?: No Currently pregnant?: No Vaginal bleeding?: No Last menstrual period?: y-10  Gastrointestinal Nausea?: No Vomiting?: No Indigestion/heartburn?: No Diarrhea?: No Constipation?: No  Constitutional Fever: No Night sweats?: No Weight loss?: No Fatigue?: No  Skin Skin rash/lesions?: No Itching?: No  Eyes Blurred vision?: No Double vision?: No  Ears/Nose/Throat Sore throat?: No Sinus problems?: No  Hematologic/Lymphatic Swollen glands?: No Easy bruising?: No  Cardiovascular Leg swelling?: No Chest pain?: No  Respiratory Cough?: No Shortness of breath?: No  Endocrine Excessive thirst?: No  Musculoskeletal Back pain?: No Joint pain?: No  Neurological Headaches?: Yes Dizziness?: No  Psychologic Depression?: No Anxiety?: No  Physical Exam: BP 130/86 mmHg  Pulse 70  Ht 5\' 2"  (1.575 m)  Wt 167 lb 6.4 oz (75.932 kg)  BMI 30.61 kg/m2  LMP 03/07/2002 (LMP Unknown)  Constitutional:  Alert and oriented, No acute distress. HEENT: [place] AT, moist mucus membranes.  Trachea midline, no masses. Cardiovascular: No clubbing, cyanosis, or edema. Respiratory: Normal respiratory effort, no increased work of breathing. GI: Abdomen is soft, nontender, nondistended, no abdominal masses GU: No CVA tenderness.  Skin:  No rashes, bruises or suspicious lesions. Lymph: No cervical or inguinal adenopathy. Neurologic: Grossly intact, no focal deficits, moving all 4 extremities. Psychiatric: Normal mood and affect.  Laboratory Data: Lab Results  Component Value Date   WBC 10.3 02/23/2015   HGB 13.6 02/23/2015   HCT 40.5 02/23/2015   MCV 83.8 02/23/2015   PLT 278 02/23/2015    Lab Results  Component Value Date   CREATININE 1.09* 02/23/2015    No results found for: PSA  No results found for: TESTOSTERONE  No results found for: HGBA1C  Urinalysis    Component Value Date/Time   COLORURINE YELLOW* 02/23/2015 1913   COLORURINE Straw 01/23/2013 1004   APPEARANCEUR CLEAR* 02/23/2015 1913   APPEARANCEUR Clear 01/23/2013 1004   LABSPEC 1.013 02/23/2015 1913   LABSPEC 1.012 01/23/2013 1004   PHURINE 6.0 02/23/2015 1913   PHURINE 6.0 01/23/2013 1004   GLUCOSEU Negative 04/21/2015 1528   GLUCOSEU Negative 01/23/2013 1004   HGBUR NEGATIVE 02/23/2015 1913   HGBUR Negative 01/23/2013 1004   BILIRUBINUR Negative 04/21/2015 1528   BILIRUBINUR NEGATIVE 02/23/2015 1913   BILIRUBINUR Negative 01/23/2013 1004   KETONESUR NEGATIVE 02/23/2015 1913   KETONESUR Negative 01/23/2013 1004   PROTEINUR NEGATIVE 02/23/2015 1913   PROTEINUR Negative 01/23/2013 1004   NITRITE Negative 04/21/2015 1528   NITRITE NEGATIVE 02/23/2015 1913   NITRITE Negative 01/23/2013 1004   LEUKOCYTESUR Negative 04/21/2015 1528   LEUKOCYTESUR NEGATIVE 02/23/2015 1913   LEUKOCYTESUR Negative 01/23/2013 1004    Pertinent Imaging: CLINICAL DATA: Left flank pain for the past 2 weeks. History of nephrolithiasis.  EXAM: CT ABDOMEN AND PELVIS WITHOUT CONTRAST  TECHNIQUE: Multidetector CT imaging of the abdomen and pelvis was performed following the standard protocol without IV contrast.  COMPARISON: Report dated 02/23/2003.  FINDINGS: Lower chest: No acute findings.  Hepatobiliary: Cholecystectomy clips. Normal non  contrasted appearance of the liver.  Pancreas: No mass or inflammatory process identified on this un-enhanced exam.  Spleen: Within normal limits in size.  Adrenals/Urinary Tract: Diffusely atrophied right kidney. Normal appearing left kidney. 9 mm calculus in the posterior aspect of the urinary bladder, inferiorly, on the right. Normal appearing adrenal glands.  Stomach/Bowel: Scattered colonic diverticula without evidence of diverticulitis. Post appendectomy changes. No gastric or small bowel abnormalities are seen.  Vascular/Lymphatic: No pathologically enlarged lymph nodes. No evidence of abdominal aortic aneurysm.  Reproductive: Surgically absent uterus. The right ovary is not visualized. 3.9 cm left ovarian cyst containing a very small, exophytic nodular component medially and a tiny wall calcification posteriorly.  Other: Bilateral inguinal hernias containing fat.  Musculoskeletal: Mild levoconvex lumbar scoliosis. Extensive facet degenerative changes at the L4-5 level with associated grade 1 anterolisthesis. Lower thoracic spine degenerative changes.  IMPRESSION: 1. 3.9 cm minimally complex left ovarian cyst. This is almost certainly benign, but follow up ultrasound is recommended in 1 year according to the Society of Radiologists in Ultrasound 2010 Consensus Conference Statement Algis Downs(D Lenis NoonLevine et al. Management of Asymptomatic Ovarian and Other Adnexal Cysts Imaged at US: Society of  Radiologists in Ultrasound Consensus Conference Statement 2010. Radiology 256 (Sept 2010): 943-954.). 2. Moderate to marked right renal atrophy. 3. 9 mm bladder calculus. 4. Bilateral inguinal hernias containing fat. 5. Mild colonic diverticulosis.    Cystoscopy Procedure Note  Patient identification was confirmed, informed consent was obtained, and patient was prepped using Betadine solution.  Lidocaine jelly was administered per urethral meatus.    Preoperative abx where  received prior to procedure.    Procedure: - Flexible cystoscope introduced, without any difficulty.   - Thorough search of the bladder revealed:    normal urethral meatus    normal urothelium    no stones in bladder    no ulcers     no tumors    no urethral polyps    no trabeculation  - The right ureteral orifices dilated. A stone is lodged in the distal UVJ on the right. Left UO was normal.  Post-Procedure: - Patient tolerated the procedure well   Assessment & Plan:    1. Right impacted UVJ stone with right atrophic kidney  The patient appears to have a stone impacted in the right UVJ on cystoscopy today. This  Likely explains her atrophic kidney as this has likely been there for some time. It was measured as 9 mm and thought to be in the bladder on recent CT scan.  She does still appear to have some function left of her right atrophic kidney though its unclear as to how much. I discussed the patient that would be in her best interest to remove this stone and unobstruct this kidney as it may still have some residual function. I did discuss that there is no way to truly tell kidneys function until it is unobstructed. We discussed cystoscopy, right ureteroscopy, stent placement with laser lithotripsy in great detail. She understands risks, benefits, indications of this procedure. She understands that whatever function of her kidney that is re: been lost is not salvageable but there still may be some residual function. We can consider a renogram after  On obstructing her system to fully evaluate the kidneys function if she is interested.  2. Right atrophic kidney As above  Return for surgery.  [name], MD  [facility] [address] [place], [place] [address] [phone]

## 2015-05-24 NOTE — Transfer of Care (Signed)
Immediate Anesthesia Transfer of Care Note  Patient: Kristine Shaw  Procedure(s) Performed: Procedure(s): URETEROSCOPY WITH HOLMIUM LASER LITHOTRIPSY (Right) CYSTOSCOPY WITH STENT PLACEMENT (Right)  Patient Location: PACU  Anesthesia Type:General  Level of Consciousness: awake, alert , oriented and patient cooperative  Airway & Oxygen Therapy: Patient Spontanous Breathing and Patient connected to face mask oxygen  Post-op Assessment: Report given to RN, Post -op Vital signs reviewed and stable and Patient moving all extremities X 4  Post vital signs: Reviewed and stable  Last Vitals:  Filed Vitals:   05/24/15 0849  BP: 157/97  Temp: 36.8 C  Resp: 16    Complications: No apparent anesthesia complications

## 2015-05-24 NOTE — Interval H&P Note (Signed)
History and Physical Interval Note:  05/24/2015 10:15 AM  Kristine Shaw  has presented today for surgery, with the diagnosis of RIGHT URETERAL STONE  The various methods of treatment have been discussed with the patient and family. After consideration of risks, benefits and other options for treatment, the patient has consented to  Procedure(s): URETEROSCOPY WITH HOLMIUM LASER LITHOTRIPSY (Right) CYSTOSCOPY WITH STENT PLACEMENT (Right) as a surgical intervention .  The patient's history has been reviewed, patient examined, no change in status, stable for surgery.  I have reviewed the patient's chart and labs.  Questions were answered to the patient's satisfaction.    RRR Unlabored resp   Hildred LaserBrian James Elvin Mccartin

## 2015-05-24 NOTE — Discharge Instructions (Signed)
AMBULATORY SURGERY  DISCHARGE INSTRUCTIONS   1) The drugs that you were given will stay in your system until tomorrow so for the next 24 hours you should not:  A) Drive an automobile B) Make any legal decisions C) Drink any alcoholic beverage   2) You may resume regular meals tomorrow.  Today it is better to start with liquids and gradually work up to solid foods.  You may eat anything you prefer, but it is better to start with liquids, then soup and crackers, and gradually work up to solid foods.   3) Please notify your doctor immediately if you have any unusual bleeding, trouble breathing, redness and pain at the surgery site, drainage, fever, or pain not relieved by medication.    4) Additional Instructions:        Please contact your physician with any problems or Same Day Surgery at [phone], Monday through Friday 6 am to 4 pm, or  at [facility] Main number at [phone].Ureteral Stent Implantation, Care After Refer to this sheet in the next few weeks. These instructions provide you with information on caring for yourself after your procedure. Your health care provider may also give you more specific instructions. Your treatment has been planned according to current medical practices, but problems sometimes occur. Call your health care provider if you have any problems or questions after your procedure. WHAT TO EXPECT AFTER THE PROCEDURE You should be back to normal activity within 48 hours after the procedure. Nausea and vomiting may occur and are commonly the result of anesthesia. It is common to experience sharp pain in the back or lower abdomen and penis with voiding. This is caused by movement of the ends of the stent with the act of urinating.It usually goes away within minutes after you have stopped urinating. HOME CARE INSTRUCTIONS Make sure to drink plenty of fluids. You may have small amounts of bleeding, causing your urine to be red. This is normal.  Certain movements may trigger pain or a feeling that you need to urinate. You may be given medicines to prevent infection or bladder spasms. Be sure to take all medicines as directed. Only take over-the-counter or prescription medicines for pain, discomfort, or fever as directed by your health care provider. Do not take aspirin, as this can make bleeding worse. Your stent will be left in until the blockage is resolved. This may take 2 weeks or longer, depending on the reason for stent implantation. You may have an X-ray exam to make sure your ureter is open and that the stent has not moved out of position (migrated). The stent can be removed by your health care provider in the office. Medicines may be given for comfort while the stent is being removed. Be sure to keep all follow-up appointments so your health care provider can check that you are healing properly. SEEK MEDICAL CARE IF:  You experience increasing pain.  Your pain medicine is not working. SEEK IMMEDIATE MEDICAL CARE IF:  Your urine is dark red or has blood clots.  You are leaking urine (incontinent).  You have a fever, chills, feeling sick to your stomach (nausea), or vomiting.  Your pain is not relieved by pain medicine.  The end of the stent comes out of the urethra.  You are unable to urinate.   This information is not intended to replace advice given to you by your health care provider. Make sure you discuss any questions you have with your health care provider.  Document Released: 10/14/2012 Document Revised: 02/16/2013 Document Reviewed: 08/26/2014 Elsevier Interactive Patient Education [employer]

## 2015-05-29 ENCOUNTER — Encounter: Payer: Self-pay | Admitting: Urology

## 2015-05-29 ENCOUNTER — Ambulatory Visit (INDEPENDENT_AMBULATORY_CARE_PROVIDER_SITE_OTHER): Payer: PRIVATE HEALTH INSURANCE | Admitting: Urology

## 2015-05-29 VITALS — BP 110/73 | HR 73 | Ht 62.0 in | Wt 166.3 lb

## 2015-05-29 DIAGNOSIS — T8384XA Pain from genitourinary prosthetic devices, implants and grafts, initial encounter: Secondary | ICD-10-CM

## 2015-05-29 DIAGNOSIS — R42 Dizziness and giddiness: Secondary | ICD-10-CM

## 2015-05-29 LAB — MICROSCOPIC EXAMINATION
Bacteria, UA: NONE SEEN
Epithelial Cells (non renal): >10 /hpf — AB (ref 0–10)

## 2015-05-29 LAB — URINALYSIS, COMPLETE
Bilirubin, UA: NEGATIVE
GLUCOSE, UA: NEGATIVE
KETONES UA: NEGATIVE
NITRITE UA: NEGATIVE
SPEC GRAV UA: 1.02 (ref 1.005–1.030)
Urobilinogen, Ur: 0.2 mg/dL (ref 0.2–1.0)
pH, UA: 6 (ref 5.0–7.5)

## 2015-05-29 MED ORDER — UROGESIC-BLUE 81.6 MG PO TABS: 1.0000 | ORAL_TABLET | Freq: Four times a day (QID) | ORAL | Status: DC

## 2015-05-29 NOTE — Progress Notes (Signed)
05/29/2015 1:24 PM   Kristine Shaw 16-Nov-1960 [id number]  Referring provider: [name] [address] [place], [place] [address]  Chief Complaint  Patient presents with  . Dizziness    Patient returned to work today after surgery ureteroscopy with laser Lithotripsy and does not feel we    HPI: Patient is a 55 year old Hispanic female who underwent cystoscopic be, right ureteroscopic, laser lithotripsy, stone basketing, right retrograde pyelogram and right ureteral stent placement by Dr. [name] on 05/24/2015 for 1 cm UVJ stone on 05/24/2015.    Patient attempted to return to work today, but she developed weakness and dizziness and her employer told her to report to [place] for further evaluation.  She states that since the surgery she has been weak and dizzy. She states that this is a typical reaction when she undergoes general anesthesia. She is able to drink fluid without nausea or vomiting.  He states that she is taking and a good amount of fluid. She is not eating as much because she states she does not feel hungry. She denies nausea.  She is also experiencing stent pain and blood in the urine.  She is not having fevers, chills, nausea or vomiting.     PMH: Past Medical History  Diagnosis Date  . Diffuse cystic mastopathy   . Kidney stone   . Back pain   . Hypertension   . BP (high blood pressure) 09/08/2013  . PONV (postoperative nausea and vomiting)     Surgical History: Past Surgical History  Procedure Laterality Date  . Cesarean section    . Abdominal hysterectomy    . Cholecystectomy    . Appendectomy    . Colonoscopy  2012    [name]  . Breast biopsy Right     ? date done by dr [name]  . Ureteroscopy with holmium laser lithotripsy Right 05/24/2015    Procedure: URETEROSCOPY WITH HOLMIUM LASER LITHOTRIPSY;  Surgeon: [name], MD;  Location: [facility] ORS;  Service: Urology;  Laterality: Right;  . Cystoscopy with stent placement Right  05/24/2015    Procedure: CYSTOSCOPY WITH STENT PLACEMENT;  Surgeon: [name], MD;  Location: [facility] ORS;  Service: Urology;  Laterality: Right;    Home Medications:    Medication List       This list is accurate as of: 05/29/15  1:24 PM.  Always use your most recent med list.               acetaminophen 500 MG tablet  Commonly known as:  TYLENOL  Take 500 mg by mouth as needed.     cephALEXin 500 MG capsule  Commonly known as:  KEFLEX  Take 1 capsule (500 mg total) by mouth 3 (three) times daily.     HYDROcodone-acetaminophen 5-325 MG tablet  Commonly known as:  NORCO  Take 1 tablet by mouth every 6 (six) hours as needed for moderate pain.     ibuprofen 600 MG tablet  Commonly known as:  ADVIL,MOTRIN  Take 600 mg by mouth as needed. Reported on 04/26/2015     losartan-hydrochlorothiazide 50-12.5 MG tablet  Commonly known as:  HYZAAR     UROGESIC-BLUE 81.6 MG Tabs  Take 1 tablet (81.6 mg total) by mouth 4 (four) times daily.        Allergies: No Known Allergies  Family History: Family History  Problem Relation Age of Onset  . Kidney disease Neg Hx   . Bladder Cancer Neg Hx  Social History:  reports that she has never smoked. She has never used smokeless tobacco. She reports that she does not drink alcohol or use illicit drugs.  ROS: UROLOGY Frequent Urination?: Yes Hard to postpone urination?: No Burning/pain with urination?: Yes Get up at night to urinate?: No Leakage of urine?: No Urine stream starts and stops?: No Trouble starting stream?: No Do you have to strain to urinate?: No Blood in urine?: No Urinary tract infection?: No Sexually transmitted disease?: No Injury to kidneys or bladder?: No Painful intercourse?: No Weak stream?: No Currently pregnant?: No Vaginal bleeding?: No Last menstrual period?: n  Gastrointestinal Nausea?: No Vomiting?: No Indigestion/heartburn?: No Diarrhea?: No Constipation?: No  Constitutional Fever:  No Night sweats?: No Weight loss?: No Fatigue?: No  Skin Skin rash/lesions?: No Itching?: No  Eyes Blurred vision?: Yes Double vision?: No  Ears/Nose/Throat Sore throat?: No Sinus problems?: No  Hematologic/Lymphatic Swollen glands?: No Easy bruising?: No  Cardiovascular Leg swelling?: No Chest pain?: No  Respiratory Cough?: No Shortness of breath?: Yes  Endocrine Excessive thirst?: No  Musculoskeletal Back pain?: No Joint pain?: No  Neurological Headaches?: No Dizziness?: Yes  Psychologic Depression?: No Anxiety?: No  Physical Exam: BP 110/73 mmHg  Pulse 73  Ht  (1.575 m)  Wt 166 lb 4.8 oz (75.433 kg)  BMI 30.41 kg/m2  LMP 03/07/2002 (LMP Unknown)  Constitutional: Well nourished. Alert and oriented, No acute distress. HEENT: [place] AT, moist mucus membranes. Trachea midline, no masses. Cardiovascular: No clubbing, cyanosis, or edema. Respiratory: Normal respiratory effort, no increased work of breathing. GI: Abdomen is soft, non tender, non distended, no abdominal masses.  Skin: No rashes, bruises or suspicious lesions. Lymph: No cervical or inguinal adenopathy. Neurologic: Grossly intact, no focal deficits, moving all 4 extremities. Psychiatric: Normal mood and affect.  Laboratory Data: Lab Results  Component Value Date   WBC 9.8 05/09/2015   HGB 13.8 05/09/2015   HCT 41.5 05/09/2015   MCV 83.7 05/09/2015   PLT 255 05/09/2015    Lab Results  Component Value Date   CREATININE 1.09* 02/23/2015    Lab Results  Component Value Date   AST 21 02/23/2015   Lab Results  Component Value Date   ALT 32 02/23/2015    Urinalysis Results for orders placed or performed in visit on 05/29/15  Microscopic Examination  Result Value Ref Range   WBC, UA 11-30 (A) 0 -  5 /hpf   RBC, UA >30 (A) 0 -  2 /hpf   Epithelial Cells (non renal) >10 (A) 0 - 10 /hpf   Bacteria, UA None seen None seen/Few  Urinalysis, Complete  Result Value Ref Range     Specific Gravity, UA 1.020 1.005 - 1.030   pH, UA 6.0 5.0 - 7.5   Color, UA Yellow Yellow   Appearance Ur Cloudy (A) Clear   Leukocytes, UA 2+ (A) Negative   Protein, UA 2+ (A) Negative/Trace   Glucose, UA Negative Negative   Ketones, UA Negative Negative   RBC, UA 3+ (A) Negative   Bilirubin, UA Negative Negative   Urobilinogen, Ur 0.2 0.2 - 1.0 mg/dL   Nitrite, UA Negative Negative   Microscopic Examination See below:      Assessment & Plan:    1. Dizziness:   Patient's vital signs are stable. She is taking in fair amount of fluid, but she is not eating. She states this is a typical reaction that she experiences after general anesthesia. She states he was probably worsened  by the stiff being and bending she has to do for her occupation.  She would like a work note to be out the rest of the week.  - Urinalysis, Complete  2. Stent pain:   Patient is reassured that the pain she is feeling with the stent is typical. I have given her samples and sent a prescription for Urogesic-Blue blue to see if this would ease some of the discomfort.  She is returning Friday to have the stent removed.   Return for return to clinic on Friday for cysto/stent removal.  These notes generated with voice recognition software. I apologize for typographical errors.  Michiel CowboySHANNON Kazuma Elena, PA-C  [facility] [address], [address] [place], [place] [address] [phone]

## 2015-05-30 LAB — STONE ANALYSIS
Ca Oxalate,Monohydr.: 95 %
Ca phos cry stone ql IR: 5 %
Stone Weight KSTONE: 78 mg

## 2015-06-02 ENCOUNTER — Ambulatory Visit (INDEPENDENT_AMBULATORY_CARE_PROVIDER_SITE_OTHER): Payer: PRIVATE HEALTH INSURANCE | Admitting: Urology

## 2015-06-02 ENCOUNTER — Encounter: Payer: Self-pay | Admitting: Urology

## 2015-06-02 VITALS — BP 146/93 | HR 66 | Ht 62.0 in | Wt 166.4 lb

## 2015-06-02 DIAGNOSIS — N201 Calculus of ureter: Secondary | ICD-10-CM | POA: Diagnosis not present

## 2015-06-02 DIAGNOSIS — N261 Atrophy of kidney (terminal): Secondary | ICD-10-CM | POA: Diagnosis not present

## 2015-06-02 LAB — URINALYSIS, COMPLETE
BILIRUBIN UA: NEGATIVE
GLUCOSE, UA: NEGATIVE
KETONES UA: NEGATIVE
NITRITE UA: NEGATIVE
PROTEIN UA: NEGATIVE
Specific Gravity, UA: 1.01 (ref 1.005–1.030)
UUROB: 0.2 mg/dL (ref 0.2–1.0)
pH, UA: 5.5 (ref 5.0–7.5)

## 2015-06-02 LAB — MICROSCOPIC EXAMINATION: Epithelial Cells (non renal): >10 /hpf — AB (ref 0–10)

## 2015-06-02 MED ORDER — CIPROFLOXACIN HCL 500 MG PO TABS: 500.0000 mg | ORAL_TABLET | Freq: Once | ORAL | Status: DC

## 2015-06-02 MED ORDER — LIDOCAINE HCL 2 % EX GEL
1.0000 "application " | Freq: Once | CUTANEOUS | Status: AC
  Administered 2015-06-02: 1 via URETHRAL

## 2015-06-02 MED ORDER — LIDOCAINE HCL 2 % EX GEL: 1.0000 "application " | Freq: Once | CUTANEOUS | Status: DC

## 2015-06-02 MED ORDER — CIPROFLOXACIN HCL 500 MG PO TABS
500.0000 mg | ORAL_TABLET | Freq: Once | ORAL | Status: AC
  Administered 2015-06-02: 500 mg via ORAL

## 2015-06-02 NOTE — Progress Notes (Signed)
06/02/2015 10:09 AM   Kristine Shaw 1960-04-24 [id number]  Ref[facility] provider: [facility] [address] [place], [place] [id number]  Chief Complaint  Patient presents with  . Cysto    stent removal    HPI: The patient is a 55 year old female who presents for right ureteral stent removal. She recently had a 1 cm stone that was impacted in the right UVJ causing mild to moderate a trephine right kidney. The stone was removed to salvage any residual renal function.    Her stone was 95% calcium oxalate monohydrate and 5% calcium phosphate.  PMH: Past Medical History  Diagnosis Date  . Diffuse cystic mastopathy   . Kidney stone   . Back pain   . Hypertension   . BP (high blood pressure) 09/08/2013  . PONV (postoperative nausea and vomiting)     Surgical History: Past Surgical History  Procedure Laterality Date  . Cesarean section    . Abdominal hysterectomy    . Cholecystectomy    . Appendectomy    . Colonoscopy  2012    [name]  . Breast biopsy Right     ? date done by dr Evette Cristalsankar  . Ureteroscopy with holmium laser lithotripsy Right 05/24/2015    Procedure: URETEROSCOPY WITH HOLMIUM LASER LITHOTRIPSY;  Surgeon: Hildred LaserBrian James Lucion Dilger, MD;  Location: [facility] ORS;  Service: Urology;  Laterality: Right;  . Cystoscopy with stent placement Right 05/24/2015    Procedure: CYSTOSCOPY WITH STENT PLACEMENT;  Surgeon: Hildred LaserBrian James Rayya Yagi, MD;  Location: [facility] ORS;  Service: Urology;  Laterality: Right;    Home Medications:    Medication List       This list is accurate as of: 06/02/15 10:09 AM.  Always use your most recent med list.               acetaminophen 500 MG tablet  Commonly known as:  TYLENOL  Take 500 mg by mouth as needed. Reported on 06/02/2015     cephALEXin 500 MG capsule  Commonly known as:  KEFLEX  Take 1 capsule (500 mg total) by mouth 3 (three) times daily.     HYDROcodone-acetaminophen 5-325 MG tablet  Commonly known as:  NORCO  Take 1  tablet by mouth every 6 (six) hours as needed for moderate pain.     ibuprofen 600 MG tablet  Commonly known as:  ADVIL,MOTRIN  Take 600 mg by mouth as needed. Reported on 06/02/2015     losartan-hydrochlorothiazide 50-12.5 MG tablet  Commonly known as:  HYZAAR     UROGESIC-BLUE 81.6 MG Tabs  Take 1 tablet (81.6 mg total) by mouth 4 (four) times daily.        Allergies: No Known Allergies  Family History: Family History  Problem Relation Age of Onset  . Kidney disease Neg Hx   . Bladder Cancer Neg Hx     Social History:  reports that she has never smoked. She has never used smokeless tobacco. She reports that she does not drink alcohol or use illicit drugs.  ROS:                                        Physical Exam: BP 146/93 mmHg  Pulse 66  Ht 5\' 2"  (1.575 m)  Wt 166 lb 6.4 oz (75.479 kg)  BMI 30.43 kg/m2  LMP 03/07/2002 (LMP Unknown)  Constitutional:  Alert and oriented, No acute distress.  HEENT: [place] AT, moist mucus membranes.  Trachea midline, no masses. Cardiovascular: No clubbing, cyanosis, or edema. Respiratory: Normal respiratory effort, no increased work of breathing. GI: Abdomen is soft, nontender, nondistended, no abdominal masses GU: No CVA tenderness.  Skin: No rashes, bruises or suspicious lesions. Lymph: No cervical or inguinal adenopathy. Neurologic: Grossly intact, no focal deficits, moving all 4 extremities. Psychiatric: Normal mood and affect.  Laboratory Data: Lab Results  Component Value Date   WBC 9.8 05/09/2015   HGB 13.8 05/09/2015   HCT 41.5 05/09/2015   MCV 83.7 05/09/2015   PLT 255 05/09/2015    Lab Results  Component Value Date   CREATININE 1.09* 02/23/2015    No results found for: PSA  No results found for: TESTOSTERONE  No results found for: HGBA1C  Urinalysis    Component Value Date/Time   COLORURINE STRAW* 05/09/2015 1231   COLORURINE Straw 01/23/2013 1004   APPEARANCEUR Cloudy* 05/29/2015  1110   APPEARANCEUR CLEAR* 05/09/2015 1231   APPEARANCEUR Clear 01/23/2013 1004   LABSPEC 1.009 05/09/2015 1231   LABSPEC 1.012 01/23/2013 1004   PHURINE 5.0 05/09/2015 1231   PHURINE 6.0 01/23/2013 1004   GLUCOSEU Negative 05/29/2015 1110   GLUCOSEU Negative 01/23/2013 1004   HGBUR 1+* 05/09/2015 1231   HGBUR Negative 01/23/2013 1004   BILIRUBINUR Negative 05/29/2015 1110   BILIRUBINUR NEGATIVE 05/09/2015 1231   BILIRUBINUR Negative 01/23/2013 1004   KETONESUR NEGATIVE 05/09/2015 1231   KETONESUR Negative 01/23/2013 1004   PROTEINUR 2+* 05/29/2015 1110   PROTEINUR NEGATIVE 05/09/2015 1231   PROTEINUR Negative 01/23/2013 1004   NITRITE Negative 05/29/2015 1110   NITRITE NEGATIVE 05/09/2015 1231   NITRITE Negative 01/23/2013 1004   LEUKOCYTESUR 2+* 05/29/2015 1110   LEUKOCYTESUR NEGATIVE 05/09/2015 1231   LEUKOCYTESUR Negative 01/23/2013 1004     Cystoscopy Procedure Note  Patient identification was confirmed, informed consent was obtained, and patient was prepped using Betadine solution.  Lidocaine jelly was administered per urethral meatus.    Preoperative abx where received prior to procedure.    Procedure: - Flexible cystoscope introduced, without any difficulty.   - Thorough search of the bladder revealed:    normal urethral meatus    Right ureteral stent removed per urethral meatus intact.  Post-Procedure: - Patient tolerated the procedure well   Assessment & Plan:    The patient has loss of right renal function secondary to her impacted right UVJ stone that has since been removed. I have offered the patient a renogram to evaluate her split function between her kidneys. I did discuss with her that this would not change her to rhythm but would give her peace of mind better knowledge of her residual function of her right kidney. She is not interested in undergoing that test at this time. We will see her back in 1 month for a renal ultrasound to ensure that her right  kidney is now draining appropriately.  1. Right impacted right UVJ stone with mild to moderate renal atrophy status post ureteroscopy and removal -Renal ultrasound in 1 month to rule out iatrogenic hydronephrosis   Return in about 4 weeks (around 06/30/2015) for with renal u/s prior.  [name], MD  [facility] [address] [place], [place] [address] [phone]

## 2015-06-30 ENCOUNTER — Encounter: Payer: Self-pay | Admitting: Urology

## 2015-06-30 ENCOUNTER — Ambulatory Visit: Payer: PRIVATE HEALTH INSURANCE | Admitting: Urology

## 2015-10-01 IMAGING — CR DG LUMBAR SPINE 2-3V
1 series · 3 of 3 positions shown · non-contrast
Comparison: none

REASON FOR EXAM: back pain
COMMENTS:

[Series 1: ap · 0.17mm/px · 3 of 3 slices shown]
[im 1/3]
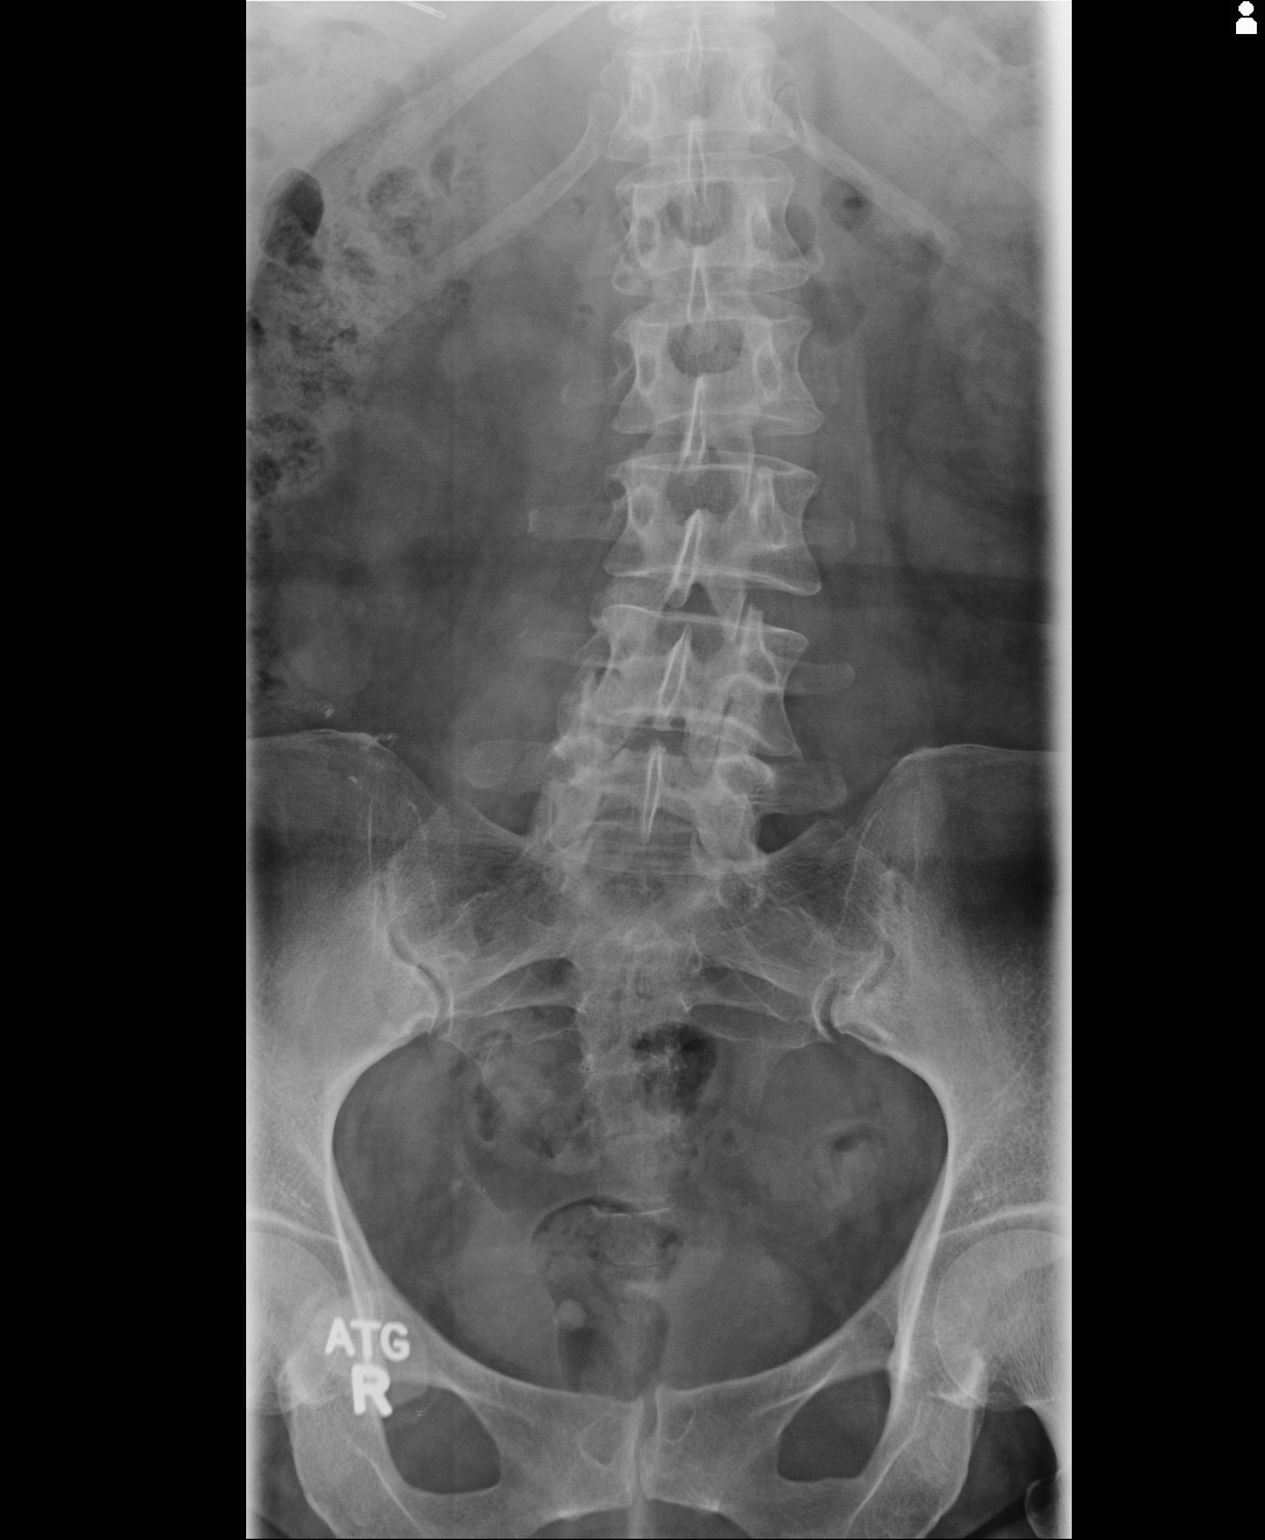
[im 2/3]
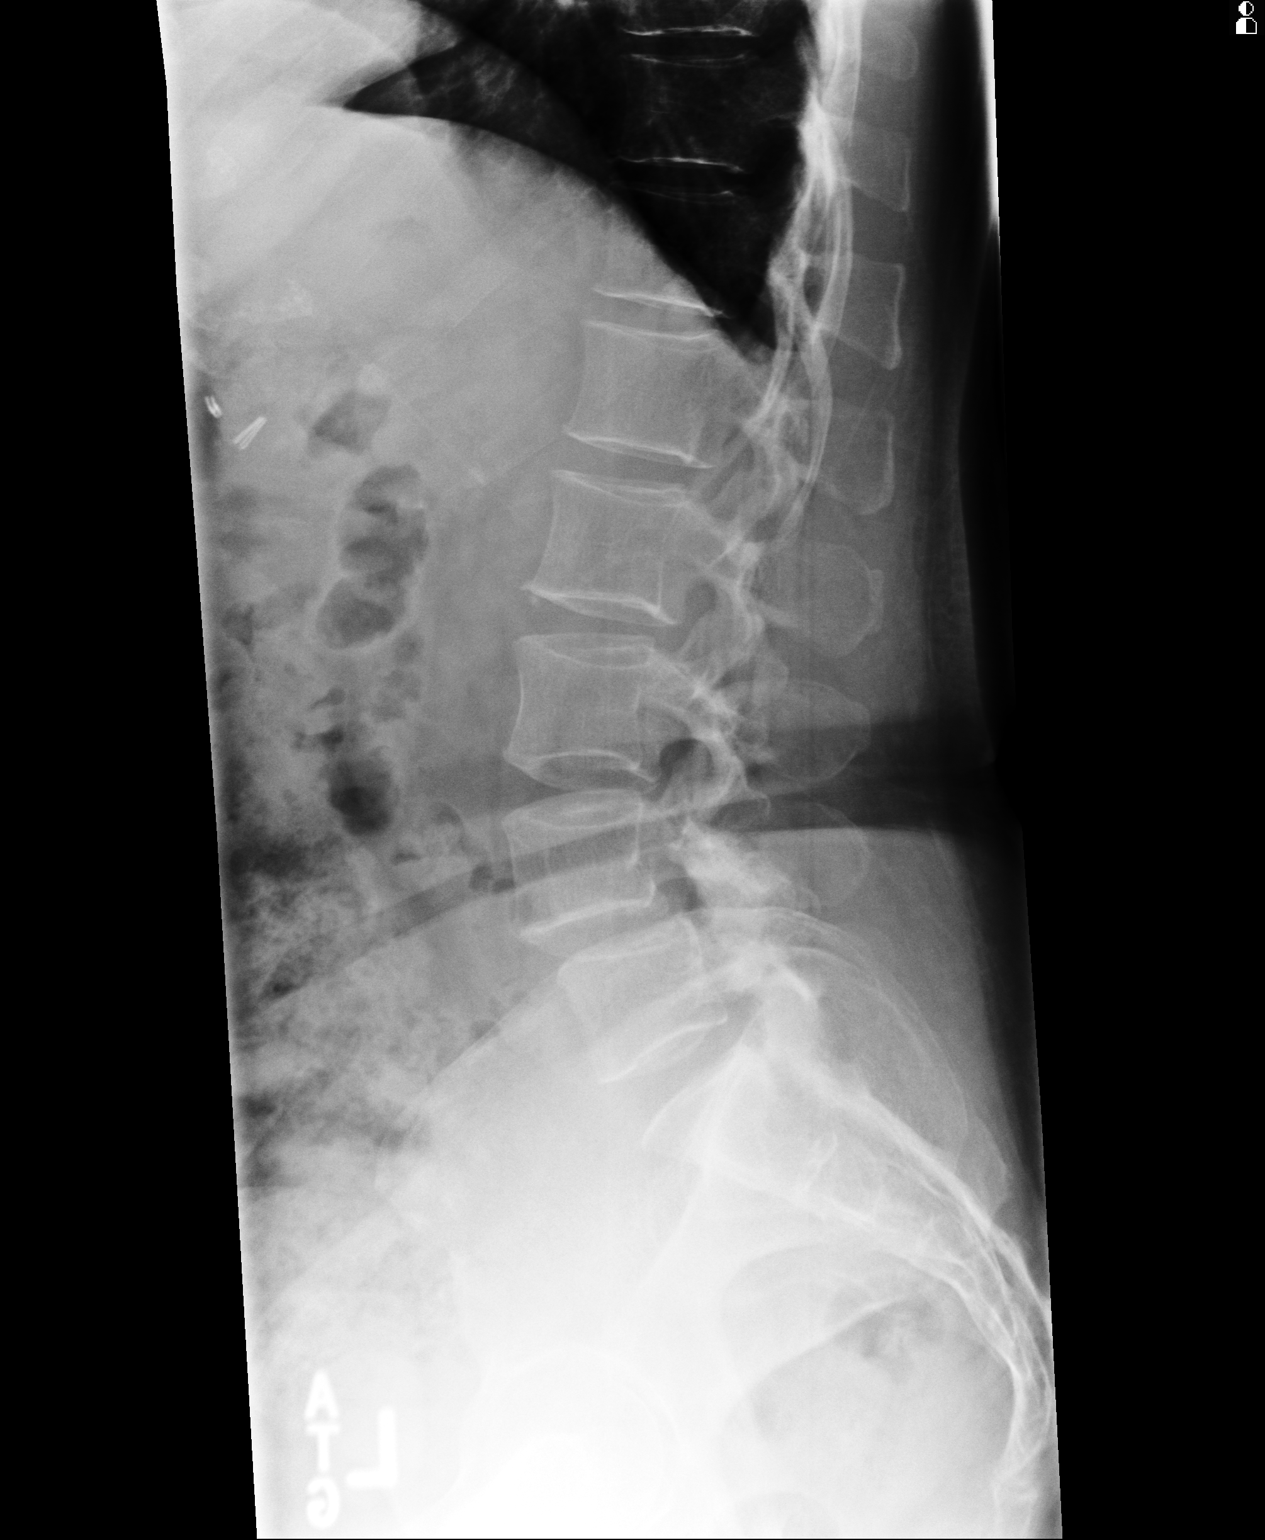
[im 3/3]
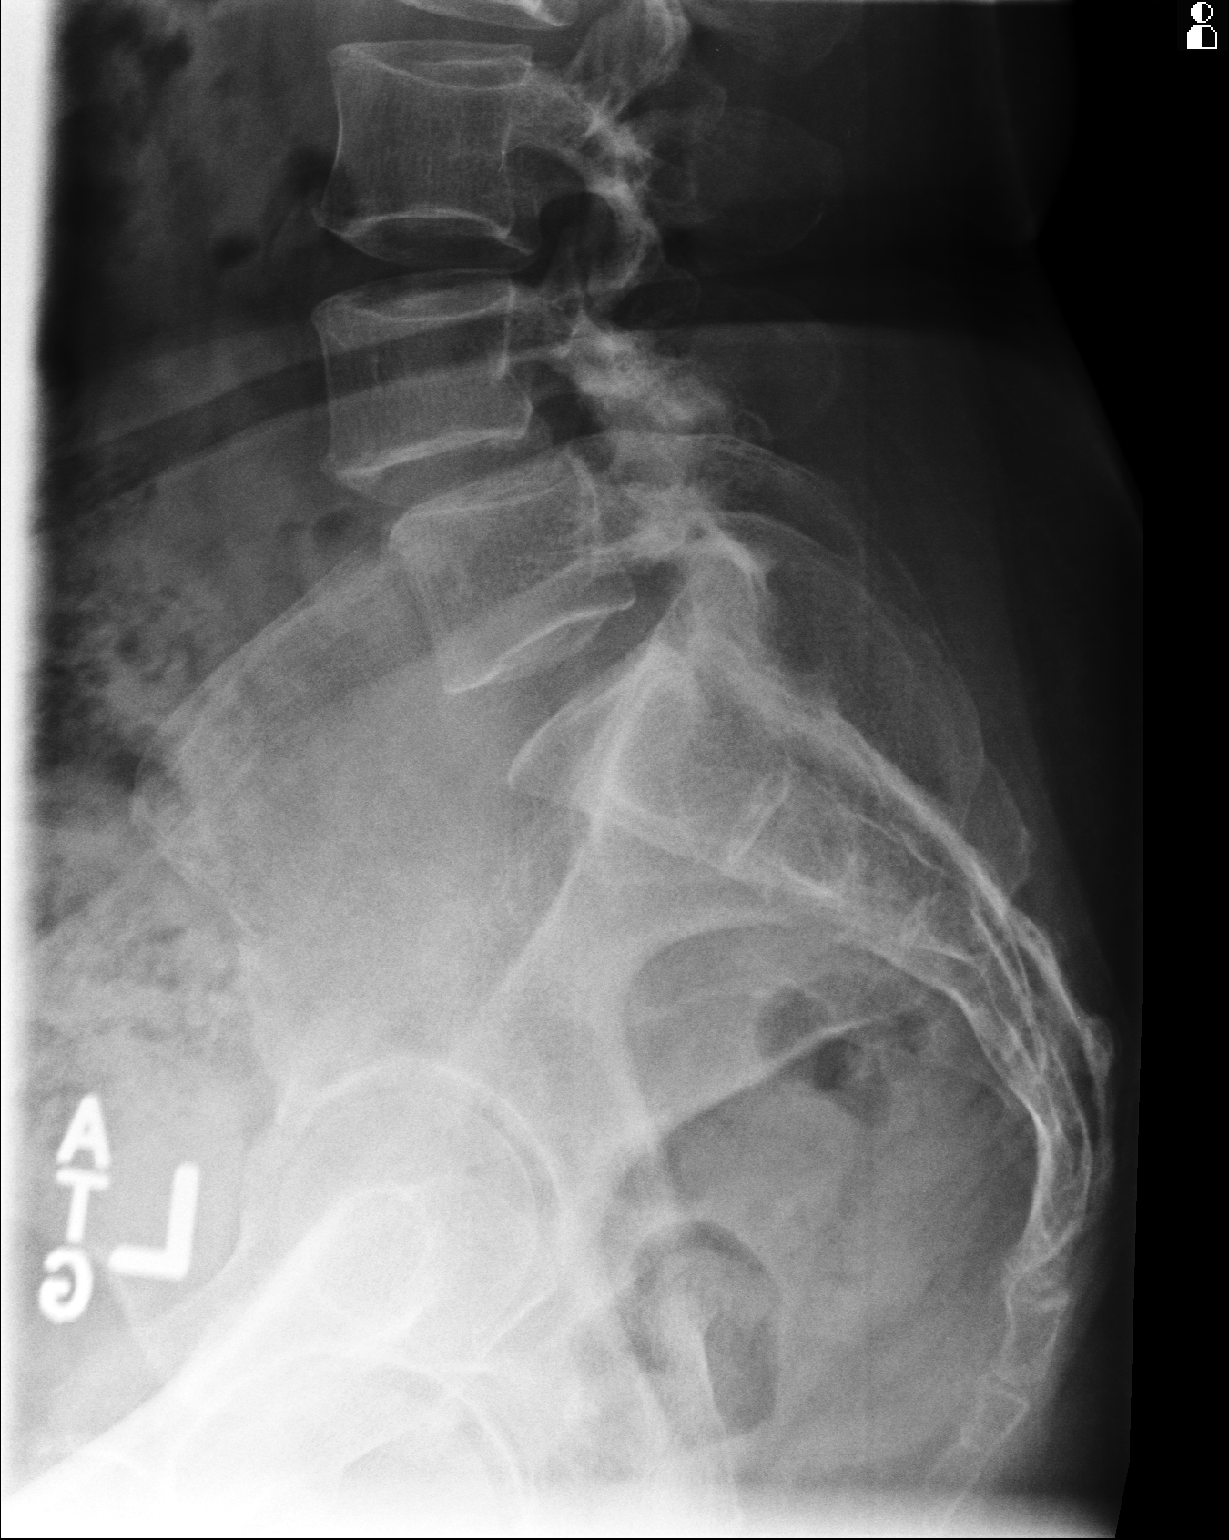

[3 of 3 positions shown; findings below may reference images not displayed]

PROCEDURE:     SATOSHI - SATOSHI LUMBAR SPINE AP AND LATERAL  - December 16, 2012 [DATE]

RESULT:     The lumbar vertebral bodies are preserved in height. There is
very gentle curvature convex toward the left. There is grade 1
anterolisthesis of L4 with respect to L5 likely on the basis of degenerative
disc and facet joint change. No pars defects are evident. The intervertebral
disc space heights are reasonably well maintained diffusely. The pedicles
and transverse processes appear intact. The observed portions of the sacrum
are normal.
IMPRESSION: 1. There is no evidence of a compression fracture.
2. There is gentle levoscoliosis of the mid lumbar spine.
3. There is grade 1 anterolisthesis of L4 with respect to L5 likely on the
basis of degenerative changes.

[REDACTED]

## 2016-05-03 ENCOUNTER — Other Ambulatory Visit: Payer: Self-pay | Admitting: Internal Medicine

## 2016-05-03 DIAGNOSIS — Z1231 Encounter for screening mammogram for malignant neoplasm of breast: Secondary | ICD-10-CM

## 2018-04-27 ENCOUNTER — Ambulatory Visit: Payer: PRIVATE HEALTH INSURANCE

## 2018-05-06 ENCOUNTER — Ambulatory Visit: Payer: Self-pay

## 2018-08-24 ENCOUNTER — Ambulatory Visit: Payer: Self-pay

## 2019-01-04 ENCOUNTER — Other Ambulatory Visit: Payer: Self-pay

## 2019-01-04 DIAGNOSIS — Z0000 Encounter for general adult medical examination without abnormal findings: Secondary | ICD-10-CM

## 2019-01-04 NOTE — Progress Notes (Signed)
Patient screened via Televisit using 2 patient identifiers.  See Documentation note.

## 2019-01-04 NOTE — Progress Notes (Signed)
Screened for patient for [facility]CP Via Televisit using 2 identifiers. She  had hysterectomy.   Patient to keep mammogram appointment at [facility] on 01/05/2019. Orders in.

## 2019-01-05 ENCOUNTER — Ambulatory Visit: Payer: PRIVATE HEALTH INSURANCE | Attending: Oncology | Admitting: *Deleted

## 2019-01-05 ENCOUNTER — Ambulatory Visit
Admission: RE | Admit: 2019-01-05 | Discharge: 2019-01-05 | Disposition: A | Discharge status: Home / Self Care | Payer: PRIVATE HEALTH INSURANCE | Source: Ambulatory Visit | Attending: Oncology | Admitting: Oncology

## 2019-01-05 ENCOUNTER — Other Ambulatory Visit: Payer: Self-pay

## 2019-01-05 ENCOUNTER — Encounter: Payer: Self-pay | Admitting: *Deleted

## 2019-01-05 DIAGNOSIS — Z0000 Encounter for general adult medical examination without abnormal findings: Secondary | ICD-10-CM

## 2019-01-05 NOTE — Progress Notes (Signed)
  Subjective:     Patient ID: Kristine Shaw, female   DOB: 05/31/1960, 58 y.o.   MRN: [id number]  HPI   Review of Systems     Objective:   Physical Exam     Assessment:     Patient presented to the [facility] today for screening mammogram after having her health history pre-screening yesterday via televisit due to Covid 19.  No pap needed per protocol since patient has a history of hysterectomy for heavy bleeding.      Plan:     Will follow up per BCCCP protocol for

## 2019-01-25 ENCOUNTER — Encounter: Payer: Self-pay | Admitting: *Deleted

## 2019-01-25 NOTE — Progress Notes (Signed)
Letter mailed from the [facility] to inform patient of her normal mammogram results.  Patient is to follow-up with annual screening in one year.  HSIS to Christy. 

## 2019-02-24 ENCOUNTER — Encounter: Payer: Self-pay | Admitting: *Deleted

## 2020-06-26 ENCOUNTER — Ambulatory Visit: Payer: Self-pay | Admitting: Nurse Practitioner

## 2023-02-10 DIAGNOSIS — I4719 Other supraventricular tachycardia: Secondary | ICD-10-CM

## 2023-02-10 HISTORY — DX: Other supraventricular tachycardia: I47.19

## 2023-11-13 ENCOUNTER — Encounter: Payer: Self-pay | Admitting: Gastroenterology

## 2023-11-13 NOTE — Anesthesia Preprocedure Evaluation (Addendum)
 Anesthesia Evaluation  Patient identified by MRN, date of birth, ID band Patient awake    Reviewed: Allergy & Precautions, H&P , NPO status , Patient's Chart, lab work & pertinent test results  History of Anesthesia Complications (+) PONV and history of anesthetic complications  Airway Mallampati: I  TM Distance: >3 FB Neck ROM: Full    Dental no notable dental hx.    Pulmonary neg pulmonary ROS   Pulmonary exam normal breath sounds clear to auscultation       Cardiovascular hypertension, Normal cardiovascular exam+ dysrhythmias  Rhythm:Regular Rate:Normal     Neuro/Psych negative neurological ROS  negative psych ROS   GI/Hepatic negative GI ROS, Neg liver ROS,,,  Endo/Other  negative endocrine ROS    Renal/GU negative Renal ROS  negative genitourinary   Musculoskeletal negative musculoskeletal ROS (+)    Abdominal   Peds negative pediatric ROS (+)  Hematology negative hematology ROS (+)   Anesthesia Other Findings 11-19-22 hypertensive emergency  Hx PONV so zofran  4 mg IV has been ordered preop  Diffuse cystic mastopathy  Kidney stone Back pain  Hypertension BP (high blood pressure)  PONV (postoperative nausea and vomiting) Sysmptomatic Atrial tachycardia (HCC)  PVC's (premature ventricular contractions) Hypertensive emergency      Reproductive/Obstetrics negative OB ROS                              Anesthesia Physical Anesthesia Plan  ASA: 2  Anesthesia Plan: General   Post-op Pain Management:    Induction: Intravenous  PONV Risk Score and Plan:   Airway Management Planned: Natural Airway and Nasal Cannula  Additional Equipment:   Intra-op Plan:   Post-operative Plan:   Informed Consent: I have reviewed the patients History and Physical, chart, labs and discussed the procedure including the risks, benefits and alternatives for the proposed anesthesia with  the patient or authorized representative who has indicated his/her understanding and acceptance.     Dental Advisory Given  Plan Discussed with: Anesthesiologist, CRNA and Surgeon  Anesthesia Plan Comments: (Patient consented for risks of anesthesia including but not limited to:  - adverse reactions to medications - risk of airway placement if required - damage to eyes, teeth, lips or other oral mucosa - nerve damage due to positioning  - sore throat or hoarseness - Damage to heart, brain, nerves, lungs, other parts of body or loss of life  Patient voiced understanding and assent.)         Anesthesia Quick Evaluation

## 2023-11-19 ENCOUNTER — Encounter: Payer: Self-pay | Admitting: Gastroenterology

## 2023-11-24 ENCOUNTER — Encounter: Payer: Self-pay | Admitting: Gastroenterology

## 2023-11-24 ENCOUNTER — Other Ambulatory Visit: Payer: Self-pay

## 2023-11-24 ENCOUNTER — Ambulatory Visit: Payer: Self-pay | Admitting: Anesthesiology

## 2023-11-24 ENCOUNTER — Ambulatory Visit
Admission: RE | Admit: 2023-11-24 | Discharge: 2023-11-24 | Disposition: A | Discharge status: Home / Self Care | Payer: PRIVATE HEALTH INSURANCE | Attending: Gastroenterology | Admitting: Gastroenterology

## 2023-11-24 ENCOUNTER — Encounter
Admission: RE | Disposition: A | Discharge status: Home / Self Care | Payer: Self-pay | Source: Home / Self Care | Attending: Gastroenterology

## 2023-11-24 DIAGNOSIS — I10 Essential (primary) hypertension: Secondary | ICD-10-CM | POA: Insufficient documentation

## 2023-11-24 DIAGNOSIS — K5730 Diverticulosis of large intestine without perforation or abscess without bleeding: Secondary | ICD-10-CM | POA: Insufficient documentation

## 2023-11-24 DIAGNOSIS — Z79899 Other long term (current) drug therapy: Secondary | ICD-10-CM | POA: Insufficient documentation

## 2023-11-24 DIAGNOSIS — Z1211 Encounter for screening for malignant neoplasm of colon: Secondary | ICD-10-CM | POA: Insufficient documentation

## 2023-11-24 HISTORY — PX: COLONOSCOPY: SHX5424

## 2023-11-24 HISTORY — DX: Cardiac arrhythmia, unspecified: I49.9

## 2023-11-24 HISTORY — DX: Hypertensive emergency: I16.1

## 2023-11-24 HISTORY — DX: Ventricular premature depolarization: I49.3

## 2023-11-24 HISTORY — DX: Personal history of urinary calculi: Z87.442

## 2023-11-24 HISTORY — DX: Prediabetes: R73.03

## 2023-11-24 HISTORY — DX: Gout, unspecified: M10.9

## 2023-11-24 SURGERY — COLONOSCOPY
Anesthesia: General

## 2023-11-24 MED ORDER — PROPOFOL 10 MG/ML IV BOLUS
INTRAVENOUS | Status: AC
  Filled 2023-11-24: qty 40

## 2023-11-24 MED ORDER — PROPOFOL 10 MG/ML IV BOLUS
INTRAVENOUS | Status: DC | PRN
Start: 2023-11-24 — End: 2023-11-24
  Administered 2023-11-24: 30 mg via INTRAVENOUS
  Administered 2023-11-24: 40 mg via INTRAVENOUS
  Administered 2023-11-24: 30 mg via INTRAVENOUS
  Administered 2023-11-24: 80 mg via INTRAVENOUS
  Administered 2023-11-24 (×3): 40 mg via INTRAVENOUS

## 2023-11-24 MED ORDER — LACTATED RINGERS IV SOLN: INTRAVENOUS | Status: DC

## 2023-11-24 MED ORDER — ONDANSETRON HCL 4 MG/2ML IJ SOLN
INTRAMUSCULAR | Status: DC | PRN
  Administered 2023-11-24: 4 mg via INTRAVENOUS

## 2023-11-24 MED ORDER — ONDANSETRON HCL 4 MG/2ML IJ SOLN
INTRAMUSCULAR | Status: AC
  Filled 2023-11-24: qty 2

## 2023-11-24 MED ORDER — LIDOCAINE HCL (PF) 2 % IJ SOLN
INTRAMUSCULAR | Status: AC
  Filled 2023-11-24: qty 5

## 2023-11-24 MED ORDER — PROPOFOL 10 MG/ML IV BOLUS
INTRAVENOUS | Status: AC
Start: 2023-11-24 — End: 2023-11-24
  Filled 2023-11-24: qty 40

## 2023-11-24 MED ORDER — LIDOCAINE HCL (CARDIAC) PF 100 MG/5ML IV SOSY
PREFILLED_SYRINGE | INTRAVENOUS | Status: DC | PRN
  Administered 2023-11-24: 50 mg via INTRAVENOUS

## 2023-11-24 MED ORDER — SODIUM CHLORIDE 0.9 % IV SOLN: INTRAVENOUS | Status: DC

## 2023-11-24 SURGICAL SUPPLY — 16 items

## 2023-11-24 NOTE — H&P (Signed)
 Kristine JONELLE Brooklyn, MD [facility], DHIP [address]  [place], [place] [address]  Main: [phone] Fax:  [phone] Pager: [phone]   Primary Care Physician:  Ernie Yancy Roof, MD Primary Gastroenterologist:  Dr. Corinn JONELLE Shaw  Pre-Procedure History & Physical: HPI:  Kristine Shaw is a 63 y.o. female is here for an colonoscopy.   Past Medical History:  Diagnosis Date   Atrial tachycardia 02/10/2023   office note: symptomatic atrial tachycardia/PVCs   Back pain    BP (high blood pressure) 09/08/2013   Diffuse cystic mastopathy    Dysrhythmia    palpitations   Gout    History of kidney stones    Hypertension    Hypertensive emergency    PONV (postoperative nausea and vomiting)    Pre-diabetes    6 years ago   PVC's (premature ventricular contractions)     Past Surgical History:  Procedure Laterality Date   ABDOMINAL HYSTERECTOMY     APPENDECTOMY     BREAST BIOPSY Right    ? date done by dr sankar   CESAREAN SECTION     CHOLECYSTECTOMY     COLONOSCOPY  2012   [name]   CYSTOSCOPY WITH STENT PLACEMENT Right 05/24/2015   Procedure: CYSTOSCOPY WITH STENT PLACEMENT;  Surgeon: Redell Lynwood Napoleon, MD;  Location: [facility] ORS;  Service: Urology;  Laterality: Right;   URETEROSCOPY WITH HOLMIUM LASER LITHOTRIPSY Right 05/24/2015   Procedure: URETEROSCOPY WITH HOLMIUM LASER LITHOTRIPSY;  Surgeon: Redell Lynwood Napoleon, MD;  Location: [facility] ORS;  Service: Urology;  Laterality: Right;    Prior to Admission medications   Medication Sig Start Date End Date Taking? Authorizing Provider  acetaminophen  (TYLENOL ) 500 MG tablet Take 500 mg by mouth as needed. Reported on 06/02/2015   Yes [provider]  allopurinol (ZYLOPRIM) 100 MG tablet Take 100 mg by mouth daily.   Yes [provider]  atorvastatin (LIPITOR) 20 MG tablet Take 20 mg by mouth daily.   Yes [provider]  cetirizine (ZYRTEC) 10 MG chewable tablet Chew  10 mg by mouth daily.   Yes [provider]  colchicine 0.6 MG tablet Take 0.6 mg by mouth daily.   Yes [provider]  losartan-hydrochlorothiazide DORISE) 50-12.5 MG tablet  04/11/14  Yes [provider]  metoprolol tartrate (LOPRESSOR) 25 MG tablet Take 25 mg by mouth 2 (two) times daily.   Yes [provider]    Allergies as of 11/08/2023   (No Known Allergies)    Family History  Problem Relation Age of Onset   Kidney disease Neg Hx    Bladder Cancer Neg Hx    Breast cancer Neg Hx     Social History   Socioeconomic History   Marital status: Married    Spouse name: Not on file   Number of children: Not on file   Years of education: Not on file   Highest education level: Not on file  Occupational History   Not on file  Tobacco Use   Smoking status: Never   Smokeless tobacco: Never  Vaping Use   Vaping status: Never Used  Substance and Sexual Activity   Alcohol use: No   Drug use: No   Sexual activity: Not on file  Other Topics Concern   Not on file  Social History Narrative   Not on file   Social Drivers of Health   Financial Resource Strain: Not on file  Food Insecurity: Not on file  Transportation  Needs: Not on file  Physical Activity: Not on file  Stress: Not on file  Social Connections: Not on file  Intimate Partner Violence: Not on file    Review of Systems: See HPI, otherwise negative ROS  Physical Exam: Ht 5' 2 (1.575 m)   Wt 74.4 kg   LMP 03/07/2002 (LMP Unknown)   BMI 30.00 kg/m  General:   Alert,  pleasant and cooperative in NAD Head:  Normocephalic and atraumatic. Neck:  Supple; no masses or thyromegaly. Lungs:  Clear throughout to auscultation.    Heart:  Regular rate and rhythm. Abdomen:  Soft, nontender and nondistended. Normal bowel sounds, without guarding, and without rebound.   Neurologic:  Alert and  oriented x4;  grossly normal neurologically.  Impression/Plan: Kristine Shaw is here  for an colonoscopy to be performed for colon cancer screening  Risks, benefits, limitations, and alternatives regarding  colonoscopy have been reviewed with the patient.  Questions have been answered.  All parties agreeable.   Kristine Brooklyn, MD  11/24/2023, 7:41 AM

## 2023-11-24 NOTE — Anesthesia Postprocedure Evaluation (Signed)
 Anesthesia Post Note  Patient: Kristine Shaw  Procedure(s) Performed: COLONOSCOPY  Patient location during evaluation: PACU Anesthesia Type: General Level of consciousness: awake and alert Pain management: pain level controlled Vital Signs Assessment: post-procedure vital signs reviewed and stable Respiratory status: spontaneous breathing, nonlabored ventilation, respiratory function stable and patient connected to nasal cannula oxygen Cardiovascular status: blood pressure returned to baseline and stable Postop Assessment: no apparent nausea or vomiting Anesthetic complications: no   No notable events documented.   Last Vitals:  Vitals:   11/24/23 0826 11/24/23 0838  BP: (!) 88/60 104/73  Pulse: 64   Resp: (!) 23   Temp: 36.7 C 36.7 C  SpO2: 94% 95%    Last Pain:  Vitals:   11/24/23 0838  TempSrc:   PainSc: 0-No pain                 Donny JAYSON Mu

## 2023-11-24 NOTE — Transfer of Care (Signed)
 Immediate Anesthesia Transfer of Care Note  Patient: Kristine Shaw  Procedure(s) Performed: COLONOSCOPY  Patient Location: PACU  Anesthesia Type: General  Level of Consciousness: awake, alert  and patient cooperative  Airway and Oxygen Therapy: Patient Spontanous Breathing and Patient connected to supplemental oxygen  Post-op Assessment: Post-op Vital signs reviewed, Patient's Cardiovascular Status Stable, Respiratory Function Stable, Patent Airway and No signs of Nausea or vomiting  Post-op Vital Signs: Reviewed and stable  Complications: No notable events documented.

## 2023-11-24 NOTE — Op Note (Signed)
 [facility]astroenter[facility] Patient Name: Kristine Shaw Procedure Date: 11/24/2023 7:28 AM MRN: [id number] Account #: [id number] Date of Birth: September 09, 1960 Admit Type: Outpatient Age: 63 Room: [facility] OR ROOM 01 Gender: Female Note Status: Finalized Instrument Name: Peds Colonoscope [id number] Procedure:             Colonoscopy Indications:           Screening for colorectal malignant neoplasm, Last                         colonoscopy: September 2012, Last colonoscopy 10 years                         ago Providers:             Corinn Jess Brooklyn MD, MD Referring MD:          Yancy Roof Revelo (Referring MD) Medicines:             General Anesthesia Complications:         No immediate complications. Estimated blood loss: None. Procedure:             Pre-Anesthesia Assessment:                        - Prior to the procedure, a History and Physical was                         performed, and patient medications and allergies were                         reviewed. The patient is competent. The risks and                         benefits of the procedure and the sedation options and                         risks were discussed with the patient. All questions                         were answered and informed consent was obtained.                         Patient identification and proposed procedure were                         verified by the physician, the nurse, the                         anesthesiologist, the anesthetist and the technician                         in the pre-procedure area in the procedure room in the                         endoscopy suite. Mental Status Examination: alert and                         oriented. Airway Examination: normal oropharyngeal  airway and neck mobility. Respiratory Examination:                         clear to auscultation. CV Examination: normal.                         Prophylactic Antibiotics: The  patient does not require                         prophylactic antibiotics. Prior Anticoagulants: The                         patient has taken no anticoagulant or antiplatelet                         agents. ASA Grade Assessment: II - A patient with mild                         systemic disease. After reviewing the risks and                         benefits, the patient was deemed in satisfactory                         condition to undergo the procedure. The anesthesia                         plan was to use general anesthesia. Immediately prior                         to administration of medications, the patient was                         re-assessed for adequacy to receive sedatives. The                         heart rate, respiratory rate, oxygen saturations,                         blood pressure, adequacy of pulmonary ventilation, and                         response to care were monitored throughout the                         procedure. The physical status of the patient was                         re-assessed after the procedure.                        After obtaining informed consent, the colonoscope was                         passed under direct vision. Throughout the procedure,                         the patient's blood pressure, pulse, and oxygen  saturations were monitored continuously. The                         Colonoscope was introduced through the anus and                         advanced to the the cecum, identified by appendiceal                         orifice and ileocecal valve. The colonoscopy was                         performed without difficulty. The patient tolerated                         the procedure well. The quality of the bowel                         preparation was evaluated using the BBPS [facility] Bowel                         Preparation Scale) with scores of: Right Colon = 3,                         Transverse Colon = 3 and  Left Colon = 3 (entire mucosa                         seen well with no residual staining, small fragments                         of stool or opaque liquid). The total BBPS score                         equals 9. The ileocecal valve, appendiceal orifice,                         and rectum were photographed. Findings:      The perianal and digital rectal examinations were normal. Pertinent       negatives include normal sphincter tone and no palpable rectal lesions.      Multiple diverticula were found in the entire colon.      The retroflexed view of the distal rectum and anal verge was normal and       showed no anal or rectal abnormalities. Impression:            - Diverticulosis in the entire examined colon.                        - The distal rectum and anal verge are normal on                         retroflexion view.                        - No specimens collected. Recommendation:        - Discharge patient to home (with escort).                        -  Resume previous diet today.                        - Continue present medications.                        - Repeat colonoscopy in 10 years for screening                         purposes. Procedure Code(s):     --- Professional ---                        H9878, Colorectal cancer screening; colonoscopy on                         individual not meeting criteria for high risk Diagnosis Code(s):     --- Professional ---                        Z12.11, Encounter for screening for malignant neoplasm                         of colon                        K57.30, Diverticulosis of large intestine without                         perforation or abscess without bleeding CPT copyright 2022 American Medical Association. All rights reserved. The codes documented in this report are preliminary and upon coder review may  be revised to meet current compliance requirements. Dr. [name] Corinn Jess Brooklyn MD, MD 11/24/2023 8:24:23 AM This  report has been signed electronically. Number of Addenda: 0 Note Initiated On: 11/24/2023 7:28 AM Scope Withdrawal Time: 0 hours 10 minutes 31 seconds  Total Procedure Duration: 0 hours 15 minutes 54 seconds  Estimated Blood Loss:  Estimated blood loss: none.      [facility]
